# Patient Record
Sex: Female | Born: 1980 | Race: White | Hispanic: No | Marital: Married | Smoking: Never smoker
Health system: Southern US, Community
[De-identification: ages and names within clinical notes are randomized; demographics above are authoritative.]

## PROBLEM LIST (undated history)

## (undated) ENCOUNTER — Inpatient Hospital Stay (HOSPITAL_COMMUNITY): Payer: Self-pay

## (undated) DIAGNOSIS — R87629 Unspecified abnormal cytological findings in specimens from vagina: Secondary | ICD-10-CM

## (undated) DIAGNOSIS — Z789 Other specified health status: Secondary | ICD-10-CM

## (undated) HISTORY — PX: COLPOSCOPY: SHX161

## (undated) HISTORY — DX: Unspecified abnormal cytological findings in specimens from vagina: R87.629

## (undated) HISTORY — PX: NO PAST SURGERIES: SHX2092

---
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
## 2001-02-03 ENCOUNTER — Emergency Department (HOSPITAL_COMMUNITY): Admission: EM | Admit: 2001-02-03 | Discharge: 2001-02-03 | Payer: Self-pay | Admitting: Emergency Medicine

## 2003-04-03 ENCOUNTER — Inpatient Hospital Stay (HOSPITAL_COMMUNITY): Admission: AD | Admit: 2003-04-03 | Discharge: 2003-04-05 | Payer: Self-pay | Admitting: Obstetrics

## 2004-01-14 ENCOUNTER — Other Ambulatory Visit: Admission: RE | Admit: 2004-01-14 | Discharge: 2004-01-14 | Payer: Self-pay | Admitting: Obstetrics and Gynecology

## 2005-01-27 ENCOUNTER — Other Ambulatory Visit: Admission: RE | Admit: 2005-01-27 | Discharge: 2005-01-27 | Payer: Self-pay | Admitting: Obstetrics and Gynecology

## 2009-02-18 ENCOUNTER — Inpatient Hospital Stay (HOSPITAL_COMMUNITY): Admission: AD | Admit: 2009-02-18 | Discharge: 2009-02-18 | Payer: Self-pay | Admitting: Obstetrics & Gynecology

## 2009-02-21 ENCOUNTER — Inpatient Hospital Stay (HOSPITAL_COMMUNITY): Admission: AD | Admit: 2009-02-21 | Discharge: 2009-02-23 | Payer: Self-pay | Admitting: Obstetrics and Gynecology

## 2010-11-21 LAB — CBC
HCT: 23.7 % — ABNORMAL LOW (ref 36.0–46.0)
HCT: 30.1 % — ABNORMAL LOW (ref 36.0–46.0)
Hemoglobin: 10.3 g/dL — ABNORMAL LOW (ref 12.0–15.0)
Hemoglobin: 8.2 g/dL — ABNORMAL LOW (ref 12.0–15.0)
MCHC: 34.1 g/dL (ref 30.0–36.0)
MCHC: 34.4 g/dL (ref 30.0–36.0)
MCV: 87.3 fL (ref 78.0–100.0)
MCV: 87.5 fL (ref 78.0–100.0)
Platelets: 183 10*3/uL (ref 150–400)
Platelets: 216 10*3/uL (ref 150–400)
RBC: 2.71 MIL/uL — ABNORMAL LOW (ref 3.87–5.11)
RBC: 3.45 MIL/uL — ABNORMAL LOW (ref 3.87–5.11)
RDW: 14.2 % (ref 11.5–15.5)
RDW: 14.2 % (ref 11.5–15.5)
WBC: 10.2 10*3/uL (ref 4.0–10.5)
WBC: 10.2 10*3/uL (ref 4.0–10.5)

## 2010-11-21 LAB — RPR: RPR Ser Ql: NONREACTIVE

## 2010-12-28 NOTE — Discharge Summary (Signed)
NAMELEYLI, KEVORKIAN NO.:  [id number]   MEDICAL RECORD NO.:  [id number]          PATIENT TYPE:  INP   LOCATION:  9146                          FACILITY:  [facility]   PHYSICIAN:  Gerrit Friends. Aldona Bar, M.D.   DATE OF BIRTH:  04-Sep-1980   DATE OF ADMISSION:  02/21/2009  DATE OF DISCHARGE:  02/23/2009                               DISCHARGE SUMMARY   DISCHARGE DIAGNOSES:  1. Term pregnancy, delivered 8 pounds female infant, Apgars 9 and 9.  2. Blood type A positive.   PROCEDURES:  1. Amniotomy.  2. Normal spontaneous delivery.  3. First-degree tear and repair.   SUMMARY:  This 30 year old gravida 5, now para 2 was admitted with  active labor on the evening of February 21, 2009, and after amniotomy which  produced clear fluid.  She had a rapid second stage and delivered a  viable 8 pounds female infant with Apgars of 9 and 9 over a first-degree  tear, which was repaired without difficulty.  Her postpartum course was  benign.  She is breast-feeding and bottle feeding.  She did desire Depo-  Provera prior to discharge for contraception.  Her discharge hemoglobin  was 8.2 with a white count of 10,200, and platelet count of 183,000.   MEDICATIONS AT THE TIME OF DISCHARGE:  Vitamins - one a day as long as  she is breast-feeding, and she will use Feosol capsules - one daily.   She will return to the office for followup in approximately 4 weeks'  time.  She will use Tylenol or Advil as needed for discomfort.  She was  given an instruction sheet at the time of discharge and all instructions  were explained and understood.   CONDITION ON DISCHARGE:  Improved.      Gerrit Friends. Aldona Bar, M.D.  Electronically Signed     RMW/MEDQ  D:  02/23/2009  T:  02/23/2009  Job:  [id number]

## 2015-04-28 ENCOUNTER — Inpatient Hospital Stay (HOSPITAL_COMMUNITY)
Admission: AD | Admit: 2015-04-28 | Discharge: 2015-04-28 | Discharge status: Against Medical Advice | Payer: Managed Care, Other (non HMO) | Source: Ambulatory Visit | Attending: Obstetrics and Gynecology | Admitting: Obstetrics and Gynecology

## 2015-04-28 DIAGNOSIS — Z5320 Procedure and treatment not carried out because of patient's decision for unspecified reasons: Secondary | ICD-10-CM | POA: Insufficient documentation

## 2015-04-28 DIAGNOSIS — R109 Unspecified abdominal pain: Secondary | ICD-10-CM | POA: Diagnosis present

## 2015-04-28 LAB — URINALYSIS, ROUTINE W REFLEX MICROSCOPIC
Bilirubin Urine: NEGATIVE
GLUCOSE, UA: NEGATIVE mg/dL
KETONES UR: NEGATIVE mg/dL
LEUKOCYTES UA: NEGATIVE
Nitrite: NEGATIVE
PH: 6 (ref 5.0–8.0)
Protein, ur: NEGATIVE mg/dL
Specific Gravity, Urine: <1.005 — ABNORMAL LOW (ref 1.005–1.030)
Urobilinogen, UA: 0.2 mg/dL (ref 0.0–1.0)

## 2015-04-28 LAB — URINE MICROSCOPIC-ADD ON

## 2015-04-28 LAB — POCT PREGNANCY, URINE: Preg Test, Ur: POSITIVE — AB

## 2015-04-28 NOTE — MAU Note (Signed)
Pt not in lobby.  

## 2015-04-28 NOTE — MAU Note (Signed)
Not in lobby

## 2015-04-28 NOTE — MAU Note (Signed)
Pt C/O bleeding with urination that started yesterday.  C/O L flank pain.  Denies vaginal bleeding or discharge.

## 2015-05-01 ENCOUNTER — Encounter (HOSPITAL_COMMUNITY): Payer: Self-pay | Admitting: *Deleted

## 2015-05-01 ENCOUNTER — Inpatient Hospital Stay (HOSPITAL_COMMUNITY)
Admission: AD | Admit: 2015-05-01 | Discharge: 2015-05-01 | Disposition: A | Discharge status: Home / Self Care | Payer: Managed Care, Other (non HMO) | Source: Ambulatory Visit | Attending: Obstetrics and Gynecology | Admitting: Obstetrics and Gynecology

## 2015-05-01 ENCOUNTER — Inpatient Hospital Stay (HOSPITAL_COMMUNITY): Payer: Managed Care, Other (non HMO)

## 2015-05-01 DIAGNOSIS — Z3A01 Less than 8 weeks gestation of pregnancy: Secondary | ICD-10-CM | POA: Diagnosis not present

## 2015-05-01 DIAGNOSIS — O209 Hemorrhage in early pregnancy, unspecified: Secondary | ICD-10-CM

## 2015-05-01 DIAGNOSIS — O3680X0 Pregnancy with inconclusive fetal viability, not applicable or unspecified: Secondary | ICD-10-CM

## 2015-05-01 DIAGNOSIS — O200 Threatened abortion: Secondary | ICD-10-CM | POA: Diagnosis not present

## 2015-05-01 HISTORY — DX: Other specified health status: Z78.9

## 2015-05-01 LAB — URINE MICROSCOPIC-ADD ON

## 2015-05-01 LAB — URINALYSIS, ROUTINE W REFLEX MICROSCOPIC
BILIRUBIN URINE: NEGATIVE
GLUCOSE, UA: NEGATIVE mg/dL
KETONES UR: NEGATIVE mg/dL
NITRITE: NEGATIVE
PH: 7 (ref 5.0–8.0)
Protein, ur: NEGATIVE mg/dL
SPECIFIC GRAVITY, URINE: 1.01 (ref 1.005–1.030)
Urobilinogen, UA: 0.2 mg/dL (ref 0.0–1.0)

## 2015-05-01 LAB — CBC
HEMATOCRIT: 38.3 % (ref 36.0–46.0)
HEMOGLOBIN: 13.2 g/dL (ref 12.0–15.0)
MCH: 32.1 pg (ref 26.0–34.0)
MCHC: 34.5 g/dL (ref 30.0–36.0)
MCV: 93.2 fL (ref 78.0–100.0)
PLATELETS: 290 10*3/uL (ref 150–400)
RBC: 4.11 MIL/uL (ref 3.87–5.11)
RDW: 13.2 % (ref 11.5–15.5)
WBC: 8.5 10*3/uL (ref 4.0–10.5)

## 2015-05-01 LAB — HCG, QUANTITATIVE, PREGNANCY: hCG, Beta Chain, Quant, S: 4337 m[IU]/mL — ABNORMAL HIGH (ref <5)

## 2015-05-01 LAB — ABO/RH: ABO/RH(D): A POS

## 2015-05-01 MED ORDER — ACETAMINOPHEN 500 MG PO TABS: 1000.0000 mg | ORAL_TABLET | Freq: Once | ORAL | Status: DC

## 2015-05-01 NOTE — MAU Provider Note (Signed)
History     CSN: [id number]  Arrival date and time: 05/01/15 1831   First Provider Initiated Contact with Patient 05/01/15 2008      Chief Complaint  Patient presents with  . Vaginal Bleeding   HPI   Ms.Cynthia Logan is a 34 y.o. female G3P2002 at [redacted]w[redacted]d presenting to MAU with vaginal bleeding. The Bleeding started Monday; last intercourse was Sunday. The bleeding is described as Brown/red in color. She has had off and on lower abdominal cramping since then as well.   She has an Appointment scheduled on 9/30 with [facility]  OB History    Gravida Para Term Preterm AB TAB SAB Ectopic Multiple Living   1               Past Medical History  Diagnosis Date  . Medical history non-contributory     Past Surgical History  Procedure Laterality Date  . No past surgeries      Family History  Problem Relation Age of Onset  . Alcohol abuse Neg Hx     Social History  Substance Use Topics  . Smoking status: Not on file  . Smokeless tobacco: Not on file  . Alcohol Use: Not on file    Allergies: Allergies not on file  No prescriptions prior to admission   Results for orders placed or performed during the hospital encounter of 05/01/15 (from the past 48 hour(s))  Urinalysis, Routine w reflex microscopic (not at [facility])     Status: Abnormal   Collection Time: 05/01/15  7:33 PM  Result Value Ref Range   Color, Urine YELLOW YELLOW   APPearance CLEAR CLEAR   Specific Gravity, Urine 1.010 1.005 - 1.030   pH 7.0 5.0 - 8.0   Glucose, UA NEGATIVE NEGATIVE mg/dL   Hgb urine dipstick LARGE (A) NEGATIVE   Bilirubin Urine NEGATIVE NEGATIVE   Ketones, ur NEGATIVE NEGATIVE mg/dL   Protein, ur NEGATIVE NEGATIVE mg/dL   Urobilinogen, UA 0.2 0.0 - 1.0 mg/dL   Nitrite NEGATIVE NEGATIVE   Leukocytes, UA TRACE (A) NEGATIVE  Urine microscopic-add on     Status: Abnormal   Collection Time: 05/01/15  7:33 PM  Result Value Ref Range   Squamous Epithelial / LPF RARE RARE   WBC, UA  0-2 <3 WBC/hpf   RBC / HPF 3-6 <3 RBC/hpf   Bacteria, UA FEW (A) RARE  CBC     Status: None   Collection Time: 05/01/15  8:28 PM  Result Value Ref Range   WBC 8.5 4.0 - 10.5 K/uL   RBC 4.11 3.87 - 5.11 MIL/uL   Hemoglobin 13.2 12.0 - 15.0 g/dL   HCT 40.9 81.1 - 91.4 %   MCV 93.2 78.0 - 100.0 fL   MCH 32.1 26.0 - 34.0 pg   MCHC 34.5 30.0 - 36.0 g/dL   RDW 78.2 95.6 - 21.3 %   Platelets 290 150 - 400 K/uL  ABO/Rh     Status: None   Collection Time: 05/01/15  8:28 PM  Result Value Ref Range   ABO/RH(D) A POS   hCG, quantitative, pregnancy     Status: Abnormal   Collection Time: 05/01/15  8:28 PM  Result Value Ref Range   hCG, Beta Chain, Quant, S 4337 (H) <5 mIU/mL    Comment:          GEST. AGE      CONC.  (mIU/mL)   <=1 WEEK        5 -  50     2 WEEKS       50 - 500     3 WEEKS       100 - 10,000     4 WEEKS     1,000 - 30,000     5 WEEKS     3,500 - 115,000   6-8 WEEKS     12,000 - 270,000    12 WEEKS     15,000 - 220,000        FEMALE AND NON-PREGNANT FEMALE:     LESS THAN 5 mIU/mL    US Ob Comp Less 14 Wks  05/01/2015   CLINICAL DATA:  34 year old female with positive pregnancy test.  EXAM: OBSTETRIC <14 WK [place] AND TRANSVAGINAL OB US  TECHNIQUE: Both transabdominal and transvaginal ultrasound examinations were performed for complete evaluation of the gestation as well as the maternal uterus, adnexal regions, and pelvic cul-de-sac. Transvaginal technique was performed to assess early pregnancy.  COMPARISON:  None.  FINDINGS: Send the uterus is anteverted. There is a cystic structure within the endometrium with some surrounding decidual reaction. No yolk sac or fetal pole identified. This cystic structure may represent an early gestational sac. A blighted ovum or pseudo gestation of an ectopic pregnancy are not excluded. Correlation with serial HCG levels follow-up with ultrasound is recommended.  Diffuse cystic structure is a true gestational sac with mean sac diameter of 6 mm  corresponds to an estimated gestational age of [redacted] weeks, 2 days.  The right ovary measures 3.8 x 2.4 x 2.8 cm. The corpus luteum is noted in the right ovary. There is a 2.9 x 1.6 x 1.7 cm paraovarian cyst in the region of the right adnexa.  The left ovary measures 2.5 x 1.6 x 2.0 cm.  IMPRESSION: Cystic structure within the endometrial as described likely an early gestational sac. Correlation with serial HCG levels and follow-up with ultrasound recommended.   Electronically Signed   By: Elgie Collard M.D.   On: 05/01/2015 22:37   US Ob Transvaginal  05/01/2015   CLINICAL DATA:  34 year old female with positive pregnancy test.  EXAM: OBSTETRIC <14 WK [place] AND TRANSVAGINAL OB US  TECHNIQUE: Both transabdominal and transvaginal ultrasound examinations were performed for complete evaluation of the gestation as well as the maternal uterus, adnexal regions, and pelvic cul-de-sac. Transvaginal technique was performed to assess early pregnancy.  COMPARISON:  None.  FINDINGS: Send the uterus is anteverted. There is a cystic structure within the endometrium with some surrounding decidual reaction. No yolk sac or fetal pole identified. This cystic structure may represent an early gestational sac. A blighted ovum or pseudo gestation of an ectopic pregnancy are not excluded. Correlation with serial HCG levels follow-up with ultrasound is recommended.  Diffuse cystic structure is a true gestational sac with mean sac diameter of 6 mm corresponds to an estimated gestational age of [redacted] weeks, 2 days.  The right ovary measures 3.8 x 2.4 x 2.8 cm. The corpus luteum is noted in the right ovary. There is a 2.9 x 1.6 x 1.7 cm paraovarian cyst in the region of the right adnexa.  The left ovary measures 2.5 x 1.6 x 2.0 cm.  IMPRESSION: Cystic structure within the endometrial as described likely an early gestational sac. Correlation with serial HCG levels and follow-up with ultrasound recommended.   Electronically Signed   By: Elgie Collard M.D.   On: 05/01/2015 22:37    Review of Systems  Constitutional: Negative for fever  and chills.  Gastrointestinal: Negative for nausea, vomiting and abdominal pain.   Physical Exam   Blood pressure 127/78, pulse 93, temperature 98.5 F (36.9 C), resp. rate 18, height 5\' 2"  (1.575 m), weight 73.483 kg (162 lb), last menstrual period 03/17/2015.  Physical Exam  Constitutional: She is oriented to person, place, and time. She appears well-developed and well-nourished. No distress.  HENT:  Head: Normocephalic.  Eyes: Pupils are equal, round, and reactive to light.  Neck: Neck supple.  GI: Soft. There is tenderness in the suprapubic area. There is no rigidity, no rebound and no guarding.  Genitourinary:  Speculum exam: Vagina - Small amount of dark red blood pooling in the vagina.  Cervix - + active bleeding  Bimanual exam: Cervix closed Uterus non tender, normal size Adnexa non tender, no masses bilaterally Chaperone present for exam.  Musculoskeletal: Normal range of motion.  Neurological: She is alert and oriented to person, place, and time.  Skin: Skin is warm. She is not diaphoretic.  Psychiatric: Her behavior is normal.    MAU Course  Procedures  None  MDM  A positive blood type  Discussed HPI> [place] and labs with Dr. Claiborne Billings at 10:45 pm. Patient to follow up in the office on Monday for repeat Quant.   Assessment and Plan   A:  1. Pregnancy of unknown anatomic location   2. Vaginal bleeding in pregnancy, first trimester   3. Threatened miscarriage    P:  Discharge home in stable condition Pelvic rest Bleeding precautions Return to MAU if symptoms worsen Call the office on Monday to schedule an appointment for a follow up beta hcg level   Duane Lope, NP 05/01/2015 8:09 PM

## 2015-05-01 NOTE — MAU Note (Signed)
Some vag bleeding since Monday. Came here Tues. But left after waiting 3 hrs. Pain lower back and R lower abd. Some bright bleeding today mostly when go to [facility]. Have used one panty liner

## 2015-05-01 NOTE — Progress Notes (Signed)
Jennifer Rasch NP in earlier to discuss test results and d/c plan. Written and verbal d/c instructions given and understanding voiced. 

## 2015-05-02 LAB — HIV ANTIBODY (ROUTINE TESTING W REFLEX): HIV Screen 4th Generation wRfx: NONREACTIVE

## 2016-03-03 LAB — OB RESULTS CONSOLE RPR: RPR: NONREACTIVE

## 2016-03-03 LAB — OB RESULTS CONSOLE RUBELLA ANTIBODY, IGM: RUBELLA: NON-IMMUNE/NOT IMMUNE

## 2016-03-03 LAB — OB RESULTS CONSOLE ANTIBODY SCREEN: ANTIBODY SCREEN: NEGATIVE

## 2016-03-03 LAB — OB RESULTS CONSOLE ABO/RH: RH Type: POSITIVE

## 2016-03-03 LAB — OB RESULTS CONSOLE GC/CHLAMYDIA
CHLAMYDIA, DNA PROBE: NEGATIVE
Gonorrhea: NEGATIVE

## 2016-03-03 LAB — OB RESULTS CONSOLE HIV ANTIBODY (ROUTINE TESTING): HIV: NONREACTIVE

## 2016-03-03 LAB — OB RESULTS CONSOLE HEPATITIS B SURFACE ANTIGEN: HEP B S AG: NEGATIVE

## 2016-03-05 ENCOUNTER — Encounter (HOSPITAL_COMMUNITY): Payer: Self-pay | Admitting: *Deleted

## 2016-08-15 NOTE — L&D Delivery Note (Signed)
Pt was admitted last pm. She was given one dose of cytotec.She was 1 cm this am. She was started on pit aug. She had intermittent variable and late decels with good reactivity. She rapidly completed the first stage. She pushed one time and had a SVD of one live viable infant over a first degree tear in the ROA position. There was a nuchal cord x 1 and a true knot x 1. Placenta- S/I wnl. Tear closed with 3-0 chromic. Baby to NBN. EBL-400cc.

## 2016-09-07 ENCOUNTER — Encounter (HOSPITAL_COMMUNITY): Payer: Self-pay | Admitting: *Deleted

## 2016-09-07 ENCOUNTER — Inpatient Hospital Stay (HOSPITAL_COMMUNITY)
Admission: AD | Admit: 2016-09-07 | Discharge: 2016-09-07 | Disposition: A | Discharge status: Home / Self Care | Payer: Commercial Managed Care - PPO | Source: Ambulatory Visit | Attending: Obstetrics & Gynecology | Admitting: Obstetrics & Gynecology

## 2016-09-07 DIAGNOSIS — O26893 Other specified pregnancy related conditions, third trimester: Secondary | ICD-10-CM | POA: Diagnosis not present

## 2016-09-07 DIAGNOSIS — Z3A34 34 weeks gestation of pregnancy: Secondary | ICD-10-CM | POA: Diagnosis not present

## 2016-09-07 DIAGNOSIS — R51 Headache: Secondary | ICD-10-CM | POA: Diagnosis not present

## 2016-09-07 DIAGNOSIS — R030 Elevated blood-pressure reading, without diagnosis of hypertension: Secondary | ICD-10-CM | POA: Diagnosis not present

## 2016-09-07 LAB — CBC
HEMATOCRIT: 30.3 % — AB (ref 36.0–46.0)
HEMOGLOBIN: 10.1 g/dL — AB (ref 12.0–15.0)
MCH: 30.7 pg (ref 26.0–34.0)
MCHC: 33.3 g/dL (ref 30.0–36.0)
MCV: 92.1 fL (ref 78.0–100.0)
Platelets: 196 10*3/uL (ref 150–400)
RBC: 3.29 MIL/uL — ABNORMAL LOW (ref 3.87–5.11)
RDW: 14.6 % (ref 11.5–15.5)
WBC: 9.2 10*3/uL (ref 4.0–10.5)

## 2016-09-07 LAB — COMPREHENSIVE METABOLIC PANEL
ALBUMIN: 3.1 g/dL — AB (ref 3.5–5.0)
ALK PHOS: 122 U/L (ref 38–126)
ALT: 23 U/L (ref 14–54)
ANION GAP: 9 (ref 5–15)
AST: 23 U/L (ref 15–41)
BILIRUBIN TOTAL: 0.6 mg/dL (ref 0.3–1.2)
BUN: 6 mg/dL (ref 6–20)
CO2: 19 mmol/L — ABNORMAL LOW (ref 22–32)
Calcium: 9 mg/dL (ref 8.9–10.3)
Chloride: 105 mmol/L (ref 101–111)
Creatinine, Ser: 0.63 mg/dL (ref 0.44–1.00)
GFR calc Af Amer: >60 mL/min (ref >60)
GFR calc non Af Amer: >60 mL/min (ref >60)
GLUCOSE: 110 mg/dL — AB (ref 65–99)
POTASSIUM: 3.9 mmol/L (ref 3.5–5.1)
SODIUM: 133 mmol/L — AB (ref 135–145)
TOTAL PROTEIN: 6.6 g/dL (ref 6.5–8.1)

## 2016-09-07 LAB — URINALYSIS, ROUTINE W REFLEX MICROSCOPIC
Bilirubin Urine: NEGATIVE
Glucose, UA: NEGATIVE mg/dL
Hgb urine dipstick: NEGATIVE
Ketones, ur: NEGATIVE mg/dL
LEUKOCYTES UA: NEGATIVE
Nitrite: NEGATIVE
PROTEIN: NEGATIVE mg/dL
SPECIFIC GRAVITY, URINE: 1.011 (ref 1.005–1.030)
pH: 6 (ref 5.0–8.0)

## 2016-09-07 LAB — PROTEIN / CREATININE RATIO, URINE
Creatinine, Urine: 58 mg/dL
PROTEIN CREATININE RATIO: 0.14 mg/mg{creat} (ref 0.00–0.15)
Total Protein, Urine: 8 mg/dL

## 2016-09-07 MED ORDER — BUTALBITAL-APAP-CAFFEINE 50-325-40 MG PO TABS
2.0000 | ORAL_TABLET | Freq: Once | ORAL | Status: AC
  Administered 2016-09-07: 2 via ORAL
  Filled 2016-09-07: qty 2

## 2016-09-07 MED ORDER — BUTALBITAL-APAP-CAFFEINE 50-325-40 MG PO TABS: 1.0000 | ORAL_TABLET | Freq: Four times a day (QID) | ORAL | 0 refills | Status: AC | PRN

## 2016-09-07 NOTE — MAU Note (Signed)
Sent from office with elevated BP,  +HA- pt took Tylenol, no relief.  Denies visual changes or epigastric. Edema increased, pitting noted in lower extremities

## 2016-09-07 NOTE — Discharge Instructions (Signed)
General Headache Without Cause Introduction A headache is pain or discomfort felt around the head or neck area. There are many causes and types of headaches. In some cases, the cause may not be found. Follow these instructions at home: Managing pain  Take over-the-counter and prescription medicines only as told by your doctor.  Lie down in a dark, quiet room when you have a headache.  If directed, apply ice to the head and neck area:  Put ice in a plastic bag.  Place a towel between your skin and the bag.  Leave the ice on for 20 minutes, 2-3 times per day.  Use a heating pad or hot shower to apply heat to the head and neck area as told by your doctor.  Keep lights dim if bright lights bother you or make your headaches worse. Eating and drinking  Eat meals on a regular schedule.  Lessen how much alcohol you drink.  Lessen how much caffeine you drink, or stop drinking caffeine. General instructions  Keep all follow-up visits as told by your doctor. This is important.  Keep a journal to find out if certain things bring on headaches. For example, write down:  What you eat and drink.  How much sleep you get.  Any change to your diet or medicines.  Relax by getting a massage or doing other relaxing activities.  Lessen stress.  Sit up straight. Do not tighten (tense) your muscles.  Do not use tobacco products. This includes cigarettes, chewing tobacco, or e-cigarettes. If you need help quitting, ask your doctor.  Exercise regularly as told by your doctor.  Get enough sleep. This often means 7-9 hours of sleep. Contact a doctor if:  Your symptoms are not helped by medicine.  You have a headache that feels different than the other headaches.  You feel sick to your stomach (nauseous) or you throw up (vomit).  You have a fever. Get help right away if:  Your headache becomes really bad.  You keep throwing up.  You have a stiff neck.  You have trouble  seeing.  You have trouble speaking.  You have pain in the eye or ear.  Your muscles are weak or you lose muscle control.  You lose your balance or have trouble walking.  You feel like you will pass out (faint) or you pass out.  You have confusion. This information is not intended to replace advice given to you by your health care provider. Make sure you discuss any questions you have with your health care provider. Document Released: 05/10/2008 Document Revised: 01/07/2016 Document Reviewed: 11/24/2014  2017 Elsevier  

## 2016-09-07 NOTE — MAU Provider Note (Signed)
History     CSN: [id number]  Arrival date and time: 09/07/16 1035   First Provider Initiated Contact with Patient 09/07/16 1111      Chief Complaint  Patient presents with  . pre-eclampsia eval  . Headache  . Hypertension   HPI   Ms.Cynthia Logan is a 36 y.o. female [phone] @ 65w4dhere in MAU with elevated BP readings. She was seen by her OB this morning in the office and had elevated BP readings. + fetal movements.   She started having a HA last night. She took 2 extra strength tylenol and then repeated that this morning. This did not help her HA. She currently rates her HA 5/10. She has had HA in the past. Prior to pregnancy she had frequent HA. Ibuprofen resolved her HA's in the past.   BP in the office 150/100 then 126/90 per Dr. PAlwyn Pea   OB History    Gravida Para Term Preterm AB Living   6 2 2  0 3 2   SAB TAB Ectopic Multiple Live Births   3 0 0 0        Past Medical History:  Diagnosis Date  . Medical history non-contributory     Past Surgical History:  Procedure Laterality Date  . NO PAST SURGERIES      Family History  Problem Relation Age of Onset  . Alcohol abuse Neg Hx     Social History  Substance Use Topics  . Smoking status: Never Smoker  . Smokeless tobacco: Not on file  . Alcohol use Yes     Comment: social but not with pregnancy    Allergies: No Known Allergies  Prescriptions Prior to Admission  Medication Sig Dispense Refill Last Dose  . acetaminophen (TYLENOL) 500 MG tablet Take 500 mg by mouth every 6 (six) hours as needed for mild pain.   09/07/2016 at Unknown time  . Prenatal Vit-Fe Fumarate-FA (PRENATAL MULTIVITAMIN) TABS tablet Take 1 tablet by mouth daily at 12 noon.   09/07/2016 at Unknown time   Results for orders placed or performed during the hospital encounter of 09/07/16 (from the past 48 hour(s))  Protein / creatinine ratio, urine     Status: None   Collection Time: 09/07/16 10:45 AM  Result Value Ref Range   Creatinine,  Urine 58.00 mg/dL   Total Protein, Urine 8 mg/dL    Comment: NO NORMAL RANGE ESTABLISHED FOR THIS TEST   Protein Creatinine Ratio 0.14 0.00 - 0.15 mg/mg[Cre]  Urinalysis, Routine w reflex microscopic     Status: Abnormal   Collection Time: 09/07/16 10:45 AM  Result Value Ref Range   Color, Urine YELLOW YELLOW   APPearance HAZY (A) CLEAR   Specific Gravity, Urine 1.011 1.005 - 1.030   pH 6.0 5.0 - 8.0   Glucose, UA NEGATIVE NEGATIVE mg/dL   Hgb urine dipstick NEGATIVE NEGATIVE   Bilirubin Urine NEGATIVE NEGATIVE   Ketones, ur NEGATIVE NEGATIVE mg/dL   Protein, ur NEGATIVE NEGATIVE mg/dL   Nitrite NEGATIVE NEGATIVE   Leukocytes, UA NEGATIVE NEGATIVE  CBC     Status: Abnormal   Collection Time: 09/07/16 11:10 AM  Result Value Ref Range   WBC 9.2 4.0 - 10.5 K/uL   RBC 3.29 (L) 3.87 - 5.11 MIL/uL   Hemoglobin 10.1 (L) 12.0 - 15.0 g/dL   HCT 30.3 (L) 36.0 - 46.0 %   MCV 92.1 78.0 - 100.0 fL   MCH 30.7 26.0 - 34.0 pg   MCHC 33.3 30.0 -  36.0 g/dL   RDW 14.6 11.5 - 15.5 %   Platelets 196 150 - 400 K/uL  Comprehensive metabolic panel     Status: Abnormal   Collection Time: 09/07/16 11:10 AM  Result Value Ref Range   Sodium 133 (L) 135 - 145 mmol/L   Potassium 3.9 3.5 - 5.1 mmol/L   Chloride 105 101 - 111 mmol/L   CO2 19 (L) 22 - 32 mmol/L   Glucose, Bld 110 (H) 65 - 99 mg/dL   BUN 6 6 - 20 mg/dL   Creatinine, Ser 0.63 0.44 - 1.00 mg/dL   Calcium 9.0 8.9 - 10.3 mg/dL   Total Protein 6.6 6.5 - 8.1 g/dL   Albumin 3.1 (L) 3.5 - 5.0 g/dL   AST 23 15 - 41 U/L   ALT 23 14 - 54 U/L   Alkaline Phosphatase 122 38 - 126 U/L   Total Bilirubin 0.6 0.3 - 1.2 mg/dL   GFR calc non Af Amer >60 >60 mL/min   GFR calc Af Amer >60 >60 mL/min    Comment: (NOTE) The eGFR has been calculated using the CKD EPI equation. This calculation has not been validated in all clinical situations. eGFR's persistently <60 mL/min signify possible Chronic Kidney Disease.    Anion gap 9 5 - 15   Review of  Systems  Eyes: Negative for visual disturbance.  Gastrointestinal: Negative for abdominal pain.  Genitourinary: Negative for pelvic pain.  Neurological: Positive for headaches (Started yesterday. ).   Physical Exam   Blood pressure 124/85, pulse 100, temperature 97.4 F (36.3 C), temperature source Oral, resp. rate 18, weight 198 lb 12 oz (90.2 kg), SpO2 100 %, unknown if currently breastfeeding.   Patient Vitals for the past 24 hrs:  BP Temp Temp src Pulse Resp SpO2 Weight  09/07/16 1132 124/85 - - 97 - - -  09/07/16 1117 126/85 - - 98 - - -  09/07/16 1102 124/85 - - 101 - - -  09/07/16 1058 119/82 - - 89 - - -  09/07/16 1052 134/85 97.4 F (36.3 C) Oral 94 18 100 % -  09/07/16 1051 - - - - - - 198 lb 12 oz (90.2 kg)    Physical Exam  Constitutional: She is oriented to person, place, and time. She appears well-developed and well-nourished. No distress.  HENT:  Head: Normocephalic.  Eyes: Pupils are equal, round, and reactive to light.  Respiratory: Effort normal.  GI: Soft. She exhibits no distension. There is no tenderness. There is no rebound.  Musculoskeletal: Normal range of motion.  Neurological: She is alert and oriented to person, place, and time. She displays normal reflexes.  Skin: Skin is warm. She is not diaphoretic.  Psychiatric: Her behavior is normal.   Fetal Tracing: Baseline: 145 bpm  Variability: Moderate  Accelerations: 15x15 Decelerations: quick variables  Toco: occasional contraction, some UI   MAU Course  Procedures  None  MDM  PIH labs NST Fioricet 2 tabs Discussed patient with Dr. Alwyn Pea @ 1215, discussed HPI, labs Ha down from 5/10 to 2/10  Assessment and Plan    A:  1. Elevated BP without diagnosis of hypertension   2. Headache in pregnancy, third trimester     P:  Discharge home in stable condition Strict return precautions Preeclampsia precautions Rx: Fioricet  Return to MAU if symptoms worsen    Lezlie Lye,  NP 09/07/2016 2:04 PM

## 2016-09-13 LAB — OB RESULTS CONSOLE GBS: GBS: NEGATIVE

## 2016-09-19 ENCOUNTER — Encounter (HOSPITAL_COMMUNITY): Payer: Self-pay | Admitting: *Deleted

## 2016-09-19 ENCOUNTER — Telehealth (HOSPITAL_COMMUNITY): Payer: Self-pay | Admitting: *Deleted

## 2016-09-19 NOTE — Telephone Encounter (Signed)
Preadmission screen  

## 2016-09-23 ENCOUNTER — Other Ambulatory Visit: Payer: Self-pay | Admitting: Obstetrics and Gynecology

## 2016-09-26 ENCOUNTER — Inpatient Hospital Stay (HOSPITAL_COMMUNITY)
Admission: RE | Admit: 2016-09-26 | Discharge: 2016-09-28 | DRG: 774 | Disposition: A | Discharge status: Home / Self Care | Payer: Commercial Managed Care - PPO | Source: Ambulatory Visit | Attending: Obstetrics and Gynecology | Admitting: Obstetrics and Gynecology

## 2016-09-26 ENCOUNTER — Encounter (HOSPITAL_COMMUNITY): Payer: Self-pay

## 2016-09-26 DIAGNOSIS — Z3A37 37 weeks gestation of pregnancy: Secondary | ICD-10-CM

## 2016-09-26 DIAGNOSIS — O1002 Pre-existing essential hypertension complicating childbirth: Principal | ICD-10-CM | POA: Diagnosis present

## 2016-09-26 DIAGNOSIS — Z3490 Encounter for supervision of normal pregnancy, unspecified, unspecified trimester: Secondary | ICD-10-CM

## 2016-09-26 LAB — COMPREHENSIVE METABOLIC PANEL
ALK PHOS: 139 U/L — AB (ref 38–126)
ALT: 21 U/L (ref 14–54)
ANION GAP: 9 (ref 5–15)
AST: 24 U/L (ref 15–41)
Albumin: 3.2 g/dL — ABNORMAL LOW (ref 3.5–5.0)
BILIRUBIN TOTAL: 0.4 mg/dL (ref 0.3–1.2)
BUN: 9 mg/dL (ref 6–20)
CALCIUM: 8.6 mg/dL — AB (ref 8.9–10.3)
CO2: 20 mmol/L — AB (ref 22–32)
CREATININE: 0.6 mg/dL (ref 0.44–1.00)
Chloride: 104 mmol/L (ref 101–111)
Glucose, Bld: 93 mg/dL (ref 65–99)
Potassium: 3.9 mmol/L (ref 3.5–5.1)
SODIUM: 133 mmol/L — AB (ref 135–145)
TOTAL PROTEIN: 6.5 g/dL (ref 6.5–8.1)

## 2016-09-26 LAB — LACTATE DEHYDROGENASE: LDH: 181 U/L (ref 98–192)

## 2016-09-26 LAB — CBC
HCT: 29.4 % — ABNORMAL LOW (ref 36.0–46.0)
HEMOGLOBIN: 9.8 g/dL — AB (ref 12.0–15.0)
MCH: 30.3 pg (ref 26.0–34.0)
MCHC: 33.3 g/dL (ref 30.0–36.0)
MCV: 91 fL (ref 78.0–100.0)
PLATELETS: 207 10*3/uL (ref 150–400)
RBC: 3.23 MIL/uL — AB (ref 3.87–5.11)
RDW: 14.9 % (ref 11.5–15.5)
WBC: 9.7 10*3/uL (ref 4.0–10.5)

## 2016-09-26 LAB — TYPE AND SCREEN
ABO/RH(D): A POS
ANTIBODY SCREEN: NEGATIVE

## 2016-09-26 LAB — PROTEIN / CREATININE RATIO, URINE
CREATININE, URINE: 68 mg/dL
Protein Creatinine Ratio: 0.19 mg/mg{Cre} — ABNORMAL HIGH (ref 0.00–0.15)
Total Protein, Urine: 13 mg/dL

## 2016-09-26 LAB — URIC ACID: URIC ACID, SERUM: 5.6 mg/dL (ref 2.3–6.6)

## 2016-09-26 LAB — RPR: RPR: NONREACTIVE

## 2016-09-26 MED ORDER — PHENYLEPHRINE 40 MCG/ML (10ML) SYRINGE FOR IV PUSH (FOR BLOOD PRESSURE SUPPORT)
80.0000 ug | PREFILLED_SYRINGE | INTRAVENOUS | Status: DC | PRN
  Filled 2016-09-26: qty 5
  Filled 2016-09-26: qty 10

## 2016-09-26 MED ORDER — OXYTOCIN 40 UNITS IN LACTATED RINGERS INFUSION - SIMPLE MED
2.5000 [IU]/h | INTRAVENOUS | Status: DC
  Filled 2016-09-26: qty 1000

## 2016-09-26 MED ORDER — TETANUS-DIPHTH-ACELL PERTUSSIS 5-2.5-18.5 LF-MCG/0.5 IM SUSP: 0.5000 mL | Freq: Once | INTRAMUSCULAR | Status: DC

## 2016-09-26 MED ORDER — ONDANSETRON HCL 4 MG PO TABS: 4.0000 mg | ORAL_TABLET | ORAL | Status: DC | PRN

## 2016-09-26 MED ORDER — PHENYLEPHRINE 40 MCG/ML (10ML) SYRINGE FOR IV PUSH (FOR BLOOD PRESSURE SUPPORT)
80.0000 ug | PREFILLED_SYRINGE | INTRAVENOUS | Status: DC | PRN
  Filled 2016-09-26: qty 5

## 2016-09-26 MED ORDER — FENTANYL 2.5 MCG/ML BUPIVACAINE 1/10 % EPIDURAL INFUSION (WH - ANES)
12.0000 mL/h | INTRAMUSCULAR | Status: DC | PRN
  Filled 2016-09-26: qty 100

## 2016-09-26 MED ORDER — TERBUTALINE SULFATE 1 MG/ML IJ SOLN
0.2500 mg | Freq: Once | INTRAMUSCULAR | Status: DC | PRN
  Filled 2016-09-26: qty 1

## 2016-09-26 MED ORDER — SIMETHICONE 80 MG PO CHEW
80.0000 mg | CHEWABLE_TABLET | ORAL | Status: DC | PRN
Start: 2016-09-26 — End: 2016-09-28

## 2016-09-26 MED ORDER — SOD CITRATE-CITRIC ACID 500-334 MG/5ML PO SOLN: 30.0000 mL | ORAL | Status: DC | PRN

## 2016-09-26 MED ORDER — EPHEDRINE 5 MG/ML INJ
10.0000 mg | INTRAVENOUS | Status: DC | PRN
  Filled 2016-09-26: qty 4

## 2016-09-26 MED ORDER — ACETAMINOPHEN 325 MG PO TABS: 650.0000 mg | ORAL_TABLET | ORAL | Status: DC | PRN

## 2016-09-26 MED ORDER — COCONUT OIL OIL: 1.0000 "application " | TOPICAL_OIL | Status: DC | PRN

## 2016-09-26 MED ORDER — OXYCODONE-ACETAMINOPHEN 5-325 MG PO TABS: 2.0000 | ORAL_TABLET | ORAL | Status: DC | PRN

## 2016-09-26 MED ORDER — ONDANSETRON HCL 4 MG/2ML IJ SOLN: 4.0000 mg | INTRAMUSCULAR | Status: DC | PRN

## 2016-09-26 MED ORDER — DIPHENHYDRAMINE HCL 50 MG/ML IJ SOLN: 12.5000 mg | INTRAMUSCULAR | Status: DC | PRN

## 2016-09-26 MED ORDER — LIDOCAINE HCL (PF) 1 % IJ SOLN
30.0000 mL | INTRAMUSCULAR | Status: DC | PRN
  Administered 2016-09-26: 30 mL via SUBCUTANEOUS
  Filled 2016-09-26: qty 30

## 2016-09-26 MED ORDER — MEASLES, MUMPS & RUBELLA VAC ~~LOC~~ INJ
0.5000 mL | INJECTION | Freq: Once | SUBCUTANEOUS | Status: DC
  Filled 2016-09-26: qty 0.5

## 2016-09-26 MED ORDER — IBUPROFEN 600 MG PO TABS
600.0000 mg | ORAL_TABLET | Freq: Four times a day (QID) | ORAL | Status: DC
  Administered 2016-09-26 – 2016-09-28 (×6): 600 mg via ORAL
  Filled 2016-09-26 (×7): qty 1

## 2016-09-26 MED ORDER — BENZOCAINE-MENTHOL 20-0.5 % EX AERO
1.0000 "application " | INHALATION_SPRAY | CUTANEOUS | Status: DC | PRN
  Filled 2016-09-26: qty 56

## 2016-09-26 MED ORDER — FLEET ENEMA 7-19 GM/118ML RE ENEM: 1.0000 | ENEMA | RECTAL | Status: DC | PRN

## 2016-09-26 MED ORDER — DIBUCAINE 1 % RE OINT: 1.0000 "application " | TOPICAL_OINTMENT | RECTAL | Status: DC | PRN

## 2016-09-26 MED ORDER — ONDANSETRON HCL 4 MG/2ML IJ SOLN: 4.0000 mg | Freq: Four times a day (QID) | INTRAMUSCULAR | Status: DC | PRN

## 2016-09-26 MED ORDER — LACTATED RINGERS IV SOLN
INTRAVENOUS | Status: DC
  Administered 2016-09-26 (×2): via INTRAVENOUS

## 2016-09-26 MED ORDER — SENNOSIDES-DOCUSATE SODIUM 8.6-50 MG PO TABS
2.0000 | ORAL_TABLET | ORAL | Status: DC
  Administered 2016-09-27 – 2016-09-28 (×2): 2 via ORAL
  Filled 2016-09-26 (×2): qty 2

## 2016-09-26 MED ORDER — LACTATED RINGERS IV SOLN: 500.0000 mL | INTRAVENOUS | Status: DC | PRN

## 2016-09-26 MED ORDER — ZOLPIDEM TARTRATE 5 MG PO TABS: 5.0000 mg | ORAL_TABLET | Freq: Every evening | ORAL | Status: DC | PRN

## 2016-09-26 MED ORDER — BUTORPHANOL TARTRATE 1 MG/ML IJ SOLN: 1.0000 mg | INTRAMUSCULAR | Status: DC | PRN

## 2016-09-26 MED ORDER — OXYCODONE-ACETAMINOPHEN 5-325 MG PO TABS: 1.0000 | ORAL_TABLET | ORAL | Status: DC | PRN

## 2016-09-26 MED ORDER — WITCH HAZEL-GLYCERIN EX PADS: 1.0000 "application " | MEDICATED_PAD | CUTANEOUS | Status: DC | PRN

## 2016-09-26 MED ORDER — OXYTOCIN BOLUS FROM INFUSION
500.0000 mL | Freq: Once | INTRAVENOUS | Status: AC
  Administered 2016-09-26: 500 mL via INTRAVENOUS

## 2016-09-26 MED ORDER — MISOPROSTOL 25 MCG QUARTER TABLET
25.0000 ug | ORAL_TABLET | ORAL | Status: DC | PRN
  Administered 2016-09-26: 25 ug via VAGINAL
  Filled 2016-09-26: qty 0.25
  Filled 2016-09-26: qty 1
  Filled 2016-09-26: qty 0.25

## 2016-09-26 MED ORDER — OXYTOCIN 40 UNITS IN LACTATED RINGERS INFUSION - SIMPLE MED
1.0000 m[IU]/min | INTRAVENOUS | Status: DC
  Administered 2016-09-26: 2 m[IU]/min via INTRAVENOUS

## 2016-09-26 MED ORDER — LACTATED RINGERS IV SOLN: 500.0000 mL | Freq: Once | INTRAVENOUS | Status: DC

## 2016-09-26 NOTE — H&P (Signed)
Pt is a 36 y/o female [id number] at 37 weeks who was admitted by Dr. Henderson CloudHorvath for an induction secondary to HTN. Pt had no genetic testing. She had borderline elevated AFI throughout preg.   PMHX: See [place] PE: VSSAF         HEENT_ wnl         ABD- soft, non tender         FHTs- reactive. IMP/ IUP at 37 wks with HTN         Borderline AFI. ? Etiology Plan/ Admit          Start induction

## 2016-09-26 NOTE — Anesthesia Pain Management Evaluation Note (Signed)
  [facility] Pain Management Visit Note  Patient: Cynthia Logan, 36 y.o., female  "Hello I am a member of the anesthesia team at [facility]. We have an anesthesia team available at all times to provide care throughout the hospital, including epidural management and anesthesia for C-section. I don't know your plan for the delivery whether it a natural birth, water birth, IV sedation, nitrous supplementation, doula or epidural, but we want to meet your pain goals."   1.Was your pain managed to your expectations on prior hospitalizations?   Yes   2.What is your expectation for pain management during this hospitalization?     Labor support without medications  3.How can we help you reach that goal? Natural.  Patient did not want to discuss other pain control options at this time.  I told patient that if she changes her mind and wishes for pain control measures, to let her Logan&D RN know.  Record the patient's initial score and the patient's pain goal.   Pain: 0  Pain Goal: 7 [facility] wants you to be able to say your pain was always managed very well.  Cynthia Logan 09/26/2016

## 2016-09-26 NOTE — Lactation Note (Signed)
This note was copied from a baby's chart. Lactation Consultation Note Experienced BF mom with 37wk. 2 day old infant.  Mom had infant latched and reports feeling baby tug.  LC observed baby with rhythmic jaw movement and encouraged mom to massage breast to increase volume baby takes in when feeding.  Mom states she has a 35 year old that she BF for 18 months and a 35 year old that she BF for 12 months.  She strictly BF them without bottle feeding at all.  She said they both fed well and latched easily.  She did state she had to pump due to mastitis with the 2nd child.  LC discussed the importance of hand expression with mom.  LC demonstrated on alternate breast while infant nursed.  Gtts of colostrum were easily expressed.  LC encouraged mom to hand express before and after feeding baby.  RN had set up pump for mom.  LC reviewed pump, pump parts, and cleaning pump parts.  Encouraged mom to pump after BF in order to stimulate breast to ensure a good milk supply if in the case infant were not effectly nursing due to age.  Mom understood this but declined to have LC assist her with pumping at this time when baby finished feeding.  LC emphasized the importance of feeding infant at least every 3 hours and a minimum of 8/12 times in a 24 hour period.  Mom and dad were very receptive to the information as well as understanding the care and tips provided on the green LPTI sheet.  LC encouraged mom to perform STS and to hand express and if infant were to sleep and not wake to feed and latch well at the breast; her colostrom could be finger fed or spoon fed to the infant.   Size 27 flanges were set up with pump.  Mom's nipples are wider and shorter shafted and have slightly tough tissue but are compressible.  Mom said she was familiar with a pump and pumping and would call out for lactation if there is discomfort when pumping.  LC will discuss with mom's RN flange size and the need to continue to follow up with efficency and  comfort ,p, feels when pumping.  LC provided mom with BF brochure and gave information about BF support groups as well as websites/phone numbers for BF resources.  Mom and dad understand to call out for any questions or concerns regarding lactation or pumping.   Patient Name: Cynthia Logan ZOXWR'UToday's Date: 09/26/2016 Reason for consult: Initial assessment   Maternal Data Has patient been taught Hand Expression?: Yes (gtts of colostrum noted when hand expressing R breast/when baby BF on L) Does the patient have breastfeeding experience prior to this delivery?: Yes  Feeding Feeding Type: Breast Fed Length of feed: 20 min  LATCH Score/Interventions Latch: Repeated attempts needed to sustain latch, nipple held in mouth throughout feeding, stimulation needed to elicit sucking reflex. Intervention(s): Adjust position;Assist with latch;Breast massage;Breast compression  Audible Swallowing: A few with stimulation Intervention(s): Skin to skin;Hand expression  Type of Nipple: Everted at rest and after stimulation (wide)  Comfort (Breast/Nipple): Soft / non-tender     Hold (Positioning): Assistance needed to correctly position infant at breast and maintain latch. Intervention(s): Breastfeeding basics reviewed;Support Pillows;Position options;Skin to skin  LATCH Score: 7  Lactation Tools Discussed/Used Tools: Flanges;Pump Flange Size: 27 Breast pump type: Double-Electric Breast Pump Pump Review: Setup, frequency, and cleaning Initiated by:: Jonny RuizKelly Travonta Gill/Martha Stringer Date initiated:: 09/26/16  Consult Status      Cynthia Logan [facility] 09/26/2016, 3:53 PM

## 2016-09-27 LAB — CBC
HCT: 25.4 % — ABNORMAL LOW (ref 36.0–46.0)
Hemoglobin: 8.6 g/dL — ABNORMAL LOW (ref 12.0–15.0)
MCH: 31 pg (ref 26.0–34.0)
MCHC: 33.9 g/dL (ref 30.0–36.0)
MCV: 91.7 fL (ref 78.0–100.0)
PLATELETS: 152 10*3/uL (ref 150–400)
RBC: 2.77 MIL/uL — ABNORMAL LOW (ref 3.87–5.11)
RDW: 15.2 % (ref 11.5–15.5)
WBC: 9 10*3/uL (ref 4.0–10.5)

## 2016-09-27 NOTE — Progress Notes (Signed)
Patient is eating, ambulating, voiding.  Pain control is good.  Appropriate lochia, no complaints.  Vitals:   09/26/16 1439 09/26/16 1556 09/26/16 2030 09/27/16 0507  BP: 118/73 133/69 119/75 112/72  Pulse: 73 74 91 88  Resp: 20 20 20 18   Temp: 98.6 F (37 C) 98.7 F (37.1 C) 98.4 F (36.9 C) 98.7 F (37.1 C)  TempSrc: Oral Oral Axillary Oral  SpO2: 100% 100% 98%   Weight:      Height:        Fundus firm Ext: no CT  Lab Results  Component Value Date   WBC 9.0 09/27/2016   HGB 8.6 (L) 09/27/2016   HCT 25.4 (L) 09/27/2016   MCV 91.7 09/27/2016   PLT 152 09/27/2016    --/--/A POS (02/12 0140)  A/P Post partum day 1. BPs wnl, will continue to monitor  Routine care.  Expect d/c 2/14    [place], [place]

## 2016-09-28 NOTE — Progress Notes (Signed)
Patient declines Rubella vaccine. Information and explanation given to patient regarding the vaccine. Patient states she does not plan to have another baby and she does not like taking vaccines.

## 2016-09-28 NOTE — Plan of Care (Signed)
Problem: Coping: Goal: Ability to identify and utilize available resources and services will improve Outcome: Completed/Met Date Met: 09/28/16 Patient reports have good support after discharge. FOB has been attentive to patient,s and newborn's needs while in the hospital. Infant is 37.2 weeks at birth with 9% weight loss since birth. Patient is experienced breastfeeding mother and her breast are full. Infant swallows with feedings when latched deep on the breast and mother keeps the baby stimulated. Lactation Consultant have consulted and nursing staff have assisted with breastfeeding. Refer to Lactation Consultant's notes.

## 2016-09-28 NOTE — Lactation Note (Signed)
This note was copied from a baby's chart. Lactation Consultation Note  Patient Name: Cynthia Logan: 09/28/2016 Reason for consult: Follow-up assessment;Infant weight loss RN had assisted Mom with latching baby in cross cradle to left breast. Baby demonstrating few good suckling bursts with some swallows noted. Fell asleep after about 10 minutes. Mom used DEBP to post pump and received total of 26 ml of colostrum. Reviewed awakening techniques and discussed paced feeding with bottle to give supplement back to baby. Feeding plan discussed for d/c home: BF every 2-3 hours waking baby as needed. Baby to BF for up to 30 minutes on 1 breast each feeding, Mom then to post pump for 15-20 minutes and give baby supplement after each feeding per LPT guidelines. Advised 15-20 ml today, increasing per hours of age. Alternate breast with each feeding.  Advised to use DR. Brown #1 bottle to give supplement.  Continue to pump/supplement till weight loss/bili level stabilize. Engorgement care reviewed if needed. Advised of OP services and support group. [facility], [facility].  Mom to call for questions/concerns.   Maternal Data    Feeding Feeding Type: Breast Milk Length of feed: 10 min  LATCH Score/Interventions Latch: Grasps breast easily, tongue down, lips flanged, rhythmical sucking. Intervention(s): Adjust position;Assist with latch;Breast compression  Audible Swallowing: Spontaneous and intermittent (with infant and/or breast stimulation) Intervention(s): Alternate breast massage  Type of Nipple: Everted at rest and after stimulation  Comfort (Breast/Nipple): Filling, red/small blisters or bruises, mild/mod discomfort  Problem noted: Filling Interventions (Filling): Hand pump;Frequent nursing;Double electric pump (infant able to latch, compressible)  Hold (Positioning): Assistance needed to  correctly position infant at breast and maintain latch. Intervention(s): Breastfeeding basics reviewed;Support Pillows;Skin to skin (Lactation consiulatant in to see patient)  LATCH Score: 8  Lactation Tools Discussed/Used Tools: Pump;Flanges Flange Size: 27 Breast pump type: Double-Electric Breast Pump   Consult Status Consult Status: Follow-up Logan: 09/28/16 Follow-up type: In-patient    Alfred LevinsGranger, Xylia Scherger Ann 09/28/2016, 11:08 AM

## 2016-09-28 NOTE — Lactation Note (Signed)
This note was copied from a baby's chart. Lactation Consultation Note  Patient Name: Cynthia Logan ZOXWR'UToday's Date: 09/28/2016 Reason for consult: Follow-up assessment;Infant weight loss Baby at 9.1% weight loss which reflects 6.3% in past 24 hours. Bili at 10.2 at 41 hours of age - high/intermediate zone. Baby 37.2 weeks. Per chart review,  Baby has been to breast 5 times in past 24 hours for 20-30 minutes. 3 voids/2 stools. Mom requested hand pump for home, she also reports having Medela DEBP. Demonstrated hand pump using 27 flange and received approx 5-10 ml of colostrum. Attempted to latch baby but baby sleepy at this visit, last BF at 0730 this am.  Reviewed with Mom the importance of baby being at breast every 2-3 hours. Encouraged supplementation with each feeding till weight loss/bili stabilizes. Encouraged pumping every 3 hours for 15-20 minutes to encourage milk production and to have EBM to supplement. Advised to follow supplemental guidelines per LPT policy. Reviewed early/LPT behaviors with Mom and why she needs to wake baby to BF and pump/supplement.  LC left phone number for Mom to call to observe feeding to assess for milk transfer at breast. Left baby STS on Mom.   Maternal Data    Feeding Feeding Type: Breast Fed Length of feed: 0 min  LATCH Score/Interventions Latch: Too sleepy or reluctant, no latch achieved, no sucking elicited.                    Lactation Tools Discussed/Used Tools: Pump;Flanges Flange Size: 27 Breast pump type: Double-Electric Breast Pump   Consult Status Consult Status: Follow-up Date: 09/28/16 Follow-up type: In-patient    Alfred LevinsGranger, Brittin Belnap Ann 09/28/2016, 9:39 AM

## 2016-09-28 NOTE — Discharge Summary (Signed)
Obstetric Discharge Summary Reason for Admission: induction of labor Prenatal Procedures: NST and ultrasound Intrapartum Procedures: spontaneous vaginal delivery Postpartum Procedures: none Complications-Operative and Postpartum: none Hemoglobin  Date Value Ref Range Status  09/27/2016 8.6 (L) 12.0 - 15.0 g/dL Final   HCT  Date Value Ref Range Status  09/27/2016 25.4 (L) 36.0 - 46.0 % Final    Physical Exam:  General: alert, cooperative and no distress Lochia: appropriate Uterine Fundus: firm perineum: healing well, no significant drainage, no dehiscence DVT Evaluation: No evidence of DVT seen on physical exam. Negative Homan's sign. No cords or calf tenderness. No significant calf/ankle edema.  Discharge Diagnoses: Term Pregnancy-delivered  Discharge Information: Date: 09/28/2016 Activity: pelvic rest Diet: routine Medications: PNV and Ibuprofen Condition: stable Instructions: refer to practice specific booklet Discharge to: home   Newborn Data: Live born female  Birth Weight: 6 lb 11.1 oz (3035 g) APGAR: 8, 9  Home with mother.  Cynthia Logan, Cynthia Logan 09/28/2016, 9:06 AM

## 2016-09-28 NOTE — Progress Notes (Signed)
Post Partum Day 2 Subjective: no complaints, up ad lib, voiding, tolerating PO, + flatus and breast feeding  Objective: Blood pressure 131/73, pulse 94, temperature 98.5 F (36.9 C), resp. rate 18, height 5' (1.524 m), weight 90.7 kg (200 lb), SpO2 98 %, unknown if currently breastfeeding.  Physical Exam:  General: alert, cooperative and no distress Lochia: appropriate Uterine Fundus: firm perineum: healing well, no significant drainage, no dehiscence DVT Evaluation: No evidence of DVT seen on physical exam. Negative Homan's sign. No cords or calf tenderness. No significant calf/ankle edema.   Recent Labs  09/26/16 0140 09/27/16 0615  HGB 9.8* 8.6*  HCT 29.4* 25.4*    Assessment/Plan: Discharge home and Breastfeeding   LOS: 2 days   Cynthia Logan Cynthia Logan 09/28/2016, 9:03 AM

## 2019-04-03 LAB — OB RESULTS CONSOLE RPR: RPR: NONREACTIVE

## 2019-04-03 LAB — OB RESULTS CONSOLE RUBELLA ANTIBODY, IGM: Rubella: NON-IMMUNE/NOT IMMUNE

## 2019-04-03 LAB — OB RESULTS CONSOLE HEPATITIS B SURFACE ANTIGEN: Hepatitis B Surface Ag: NEGATIVE

## 2019-04-03 LAB — OB RESULTS CONSOLE HIV ANTIBODY (ROUTINE TESTING): HIV: NONREACTIVE

## 2019-06-19 LAB — OB RESULTS CONSOLE RPR: RPR: NONREACTIVE

## 2019-08-16 NOTE — L&D Delivery Note (Signed)
Delivery Note Cynthia Logan is a [id number] at [redacted]w[redacted]d who had a spontaneous delivery at 1827 on 09/04/19   a viable female was delivered via LOA.  APGAR: 8,9; weight 3856g (8lb8oz).    Admitted for labor, found to have severe GHTN. Progressed rapidly. Pushed for 2 minutes. Baby was delivered without difficulty. One nuchal cord easily reduced. Delayed cord clamping for 60 seconds. Delivery of placenta was spontaneous. Placenta was found to be intact 3 -vessel cord was noted. The fundus was found to be firm. 1st degree perineal laceration was repaired in the normal sterile fashion with 3-0 vicryl and 10cc of lidocaine. Estimated blood loss 50cc. Instrument and gauze counts were correct at the end of the procedure. Did not receive an epidural for pain management.  Mom to postpartum.  Baby to Couplet care / Skin to Skin.  Cynthia Logan 09/04/2019, 6:51 PM

## 2019-08-23 ENCOUNTER — Other Ambulatory Visit: Payer: Self-pay

## 2019-08-23 ENCOUNTER — Inpatient Hospital Stay (HOSPITAL_COMMUNITY)
Admission: AD | Admit: 2019-08-23 | Discharge: 2019-08-24 | Disposition: A | Discharge status: Home / Self Care | Payer: Medicaid Other | Attending: Obstetrics & Gynecology | Admitting: Obstetrics & Gynecology

## 2019-08-23 ENCOUNTER — Encounter (HOSPITAL_COMMUNITY): Payer: Self-pay | Admitting: Obstetrics & Gynecology

## 2019-08-23 DIAGNOSIS — O368130 Decreased fetal movements, third trimester, not applicable or unspecified: Secondary | ICD-10-CM

## 2019-08-23 DIAGNOSIS — O23593 Infection of other part of genital tract in pregnancy, third trimester: Secondary | ICD-10-CM | POA: Diagnosis not present

## 2019-08-23 DIAGNOSIS — N760 Acute vaginitis: Secondary | ICD-10-CM

## 2019-08-23 DIAGNOSIS — O26893 Other specified pregnancy related conditions, third trimester: Secondary | ICD-10-CM

## 2019-08-23 DIAGNOSIS — Z3A36 36 weeks gestation of pregnancy: Secondary | ICD-10-CM | POA: Diagnosis not present

## 2019-08-23 DIAGNOSIS — Z3689 Encounter for other specified antenatal screening: Secondary | ICD-10-CM

## 2019-08-23 DIAGNOSIS — Z7982 Long term (current) use of aspirin: Secondary | ICD-10-CM | POA: Insufficient documentation

## 2019-08-23 DIAGNOSIS — Z79899 Other long term (current) drug therapy: Secondary | ICD-10-CM | POA: Diagnosis not present

## 2019-08-23 DIAGNOSIS — M549 Dorsalgia, unspecified: Secondary | ICD-10-CM | POA: Diagnosis not present

## 2019-08-23 DIAGNOSIS — B9689 Other specified bacterial agents as the cause of diseases classified elsewhere: Secondary | ICD-10-CM

## 2019-08-23 LAB — WET PREP, GENITAL
Sperm: NONE SEEN
Trich, Wet Prep: NONE SEEN
Yeast Wet Prep HPF POC: NONE SEEN

## 2019-08-23 LAB — URINALYSIS, ROUTINE W REFLEX MICROSCOPIC
Bilirubin Urine: NEGATIVE
Glucose, UA: NEGATIVE mg/dL
Hgb urine dipstick: NEGATIVE
Ketones, ur: 5 mg/dL — AB
Leukocytes,Ua: NEGATIVE
Nitrite: NEGATIVE
Protein, ur: 30 mg/dL — AB
Specific Gravity, Urine: 1.019 (ref 1.005–1.030)
pH: 6 (ref 5.0–8.0)

## 2019-08-23 MED ORDER — CYCLOBENZAPRINE HCL 10 MG PO TABS
10.0000 mg | ORAL_TABLET | Freq: Once | ORAL | Status: AC
  Administered 2019-08-23: 23:00:00 10 mg via ORAL
  Filled 2019-08-23: qty 1

## 2019-08-23 NOTE — MAU Note (Signed)
PT SAYS SHE WAS IN OFFICE  LAST WED - NO VE.  DENIES HSV AND MRSA.Marland Kitchen GBS-  NOT COLLECTED. FEELING  PRESSURE IN LOWER ABD AND BACK HURTS.

## 2019-08-23 NOTE — MAU Provider Note (Signed)
History     CSN: [id number]  Arrival date and time: 08/23/19 2212   First Provider Initiated Contact with Patient 08/23/19 2313      Chief Complaint  Patient presents with  . Back Pain  . Decreased Fetal Movement   Cynthia Logan is a 39 y.o. [id number] at [redacted]w[redacted]d who receives care at [facility].  She presents today for Back Pain and Decreased Fetal Movement.  She states she has been experiencing back pain throughout the pregnancy, but today it has gotten worse.  She states she has not taken anything for the pain and rates it a 7/10.  She also reports some decreased fetal movement stating earlier today "he wasn't moving as much."  However, patient endorses perception of movement now.  Patient also reports some vaginal discharge, that she thought may have been fluid leaking.  She states she noticed the increased discharge around 3-4pm and did not require usage of a pad, did not notice any apparent odor, and states it was a clear milky color.  Patient denies sexual activity in the last 3 days.  She endorses contractions, but states they "are small at the bottom of my stomach."    OB History    Gravida  7   Para  3   Term  3   Preterm  0   AB  3   Living  3     SAB  3   TAB  0   Ectopic  0   Multiple  0   Live Births  3           Past Medical History:  Diagnosis Date  . Medical history non-contributory   . Vaginal Pap smear, abnormal     Past Surgical History:  Procedure Laterality Date  . COLPOSCOPY    . NO PAST SURGERIES      Family History  Problem Relation Age of Onset  . Alcohol abuse Neg Hx     Social History   Tobacco Use  . Smoking status: Never Smoker  . Smokeless tobacco: Never Used  Substance Use Topics  . Alcohol use: Yes    Comment: social but not with pregnancy  . Drug use: No    Allergies: No Known Allergies  Medications Prior to Admission  Medication Sig Dispense Refill Last Dose  . aspirin EC 81 MG tablet Take 81 mg by  mouth daily.   08/23/2019 at Unknown time  . Prenatal Vit-Fe Fumarate-FA (PRENATAL MULTIVITAMIN) TABS tablet Take 1 tablet by mouth daily at 12 noon.   08/23/2019 at Unknown time  . acetaminophen (TYLENOL) 500 MG tablet Take 500 mg by mouth every 6 (six) hours as needed for mild pain.   More than a month at Unknown time    Review of Systems  Constitutional: Negative for chills and fever.  Eyes: Negative for visual disturbance.  Respiratory: Negative for cough and shortness of breath.   Gastrointestinal: Negative for abdominal pain, constipation, diarrhea, nausea and vomiting.  Genitourinary: Positive for pelvic pain (Pressure) and vaginal discharge. Negative for difficulty urinating, dysuria and vaginal bleeding.  Musculoskeletal: Positive for back pain.  Neurological: Negative for dizziness, light-headedness and headaches.   Physical Exam   Blood pressure 133/84, pulse (!) 107, temperature 97.8 F (36.6 C), resp. rate 18, height 5\' 2"  (1.575 m), weight 96.2 kg, unknown if currently breastfeeding.  Physical Exam  Constitutional: She appears well-developed and well-nourished.  HENT:  Head: Normocephalic and atraumatic.  Eyes: Conjunctivae are normal.  Cardiovascular: Normal rate, regular rhythm and normal heart sounds.  Respiratory: Effort normal.  GI: Soft. There is no abdominal tenderness.  Genitourinary:    Genitourinary Comments: GC/CT and Wet Prep collected via Blind Swab by Nurse.   Musculoskeletal:        General: No edema. Normal range of motion.     Cervical back: Normal range of motion.  Neurological: She is alert.  Skin: Skin is warm and dry.  Psychiatric: She has a normal mood and affect. Her behavior is normal.    Fetal Assessment 145 bpm, Mod Var, -Decels, +Accels Toco: Irritability  MAU Course   Results for orders placed or performed during the hospital encounter of 08/23/19 (from the past 24 hour(s))  Urinalysis, Routine w reflex microscopic     Status: Abnormal    Collection Time: 08/23/19 10:27 PM  Result Value Ref Range   Color, Urine YELLOW YELLOW   APPearance CLOUDY (A) CLEAR   Specific Gravity, Urine 1.019 1.005 - 1.030   pH 6.0 5.0 - 8.0   Glucose, UA NEGATIVE NEGATIVE mg/dL   Hgb urine dipstick NEGATIVE NEGATIVE   Bilirubin Urine NEGATIVE NEGATIVE   Ketones, ur 5 (A) NEGATIVE mg/dL   Protein, ur 30 (A) NEGATIVE mg/dL   Nitrite NEGATIVE NEGATIVE   Leukocytes,Ua NEGATIVE NEGATIVE   RBC / HPF 0-5 0 - 5 RBC/hpf   WBC, UA 0-5 0 - 5 WBC/hpf   Bacteria, UA RARE (A) NONE SEEN   Squamous Epithelial / LPF 6-10 0 - 5   Mucus PRESENT   Wet prep, genital     Status: Abnormal   Collection Time: 08/23/19 11:05 PM   Specimen: PATH Cytology Cervicovaginal Ancillary Only  Result Value Ref Range   Yeast Wet Prep HPF POC NONE SEEN NONE SEEN   Trich, Wet Prep NONE SEEN NONE SEEN   Clue Cells Wet Prep HPF POC PRESENT (A) NONE SEEN   WBC, Wet Prep HPF POC MODERATE (A) NONE SEEN   Sperm NONE SEEN    No results found.  MDM PE Labs: Wet Prep, GC/CT  EFM Pain Medication  Assessment and Plan  39 year old [id number]  SIUP at 36weeks Cat I FT Back Pain  -Exam findings discussed. -Discussed usage of muscle relaxant for back pain. -Patient agreeable and given Flexeril 10mg . -Will send cultures. -Will reassess.   MSN, CNM 08/23/2019, 11:13 PM   Reassessment (12:18 AM)  -Patient reports back pain has improved with Flexeril dosing. -Informed of wet prep findings significant for bacterial vaginosis.  -Patient reports history and prefers oral treatment.  -Rx for Flagyl 500mg  BID sent to pharmacy on file.  -Will also send script for flexeril. -Discussed usage of tylenol and warm compresses for back pain prior to usage of Flexeril. -Patient verbalizes understanding and has no questions or concerns. -Encouraged to call or return to MAU if symptoms worsen or with the onset of new symptoms. -Discharged to home in improved  condition.  10/21/2019 MSN, CNM Advanced Practice Provider, [facility] 

## 2019-08-23 NOTE — MAU Note (Signed)
I have been having a lot of pelvic pressure but today has been really uncomfortable. My lower back is very painful. Baby not moving as much as usual today. Denies vag bleeding and no LOF. More vag d/c today.

## 2019-08-24 MED ORDER — CYCLOBENZAPRINE HCL 10 MG PO TABS: 10.0000 mg | ORAL_TABLET | Freq: Two times a day (BID) | ORAL | 0 refills | Status: DC | PRN

## 2019-08-24 MED ORDER — METRONIDAZOLE 500 MG PO TABS: 500.0000 mg | ORAL_TABLET | Freq: Two times a day (BID) | ORAL | 0 refills | Status: DC

## 2019-08-24 NOTE — Discharge Instructions (Signed)

## 2019-08-26 LAB — GC/CHLAMYDIA PROBE AMP (~~LOC~~) NOT AT ARMC
Chlamydia: NEGATIVE
Comment: NEGATIVE
Comment: NORMAL
Neisseria Gonorrhea: NEGATIVE

## 2019-08-28 LAB — OB RESULTS CONSOLE GBS: GBS: NEGATIVE

## 2019-09-04 ENCOUNTER — Encounter (HOSPITAL_COMMUNITY): Payer: Self-pay | Admitting: Obstetrics & Gynecology

## 2019-09-04 ENCOUNTER — Inpatient Hospital Stay (HOSPITAL_COMMUNITY)
Admission: AD | Admit: 2019-09-04 | Discharge: 2019-09-06 | DRG: 807 | Disposition: A | Discharge status: Home / Self Care | Payer: Medicaid Other | Attending: Obstetrics & Gynecology | Admitting: Obstetrics & Gynecology

## 2019-09-04 ENCOUNTER — Other Ambulatory Visit: Payer: Self-pay

## 2019-09-04 DIAGNOSIS — O99214 Obesity complicating childbirth: Secondary | ICD-10-CM | POA: Diagnosis present

## 2019-09-04 DIAGNOSIS — O163 Unspecified maternal hypertension, third trimester: Secondary | ICD-10-CM | POA: Diagnosis not present

## 2019-09-04 DIAGNOSIS — Z3A37 37 weeks gestation of pregnancy: Secondary | ICD-10-CM | POA: Diagnosis not present

## 2019-09-04 DIAGNOSIS — E669 Obesity, unspecified: Secondary | ICD-10-CM | POA: Diagnosis present

## 2019-09-04 DIAGNOSIS — O134 Gestational [pregnancy-induced] hypertension without significant proteinuria, complicating childbirth: Principal | ICD-10-CM | POA: Diagnosis present

## 2019-09-04 DIAGNOSIS — Z20822 Contact with and (suspected) exposure to covid-19: Secondary | ICD-10-CM | POA: Diagnosis present

## 2019-09-04 DIAGNOSIS — O26893 Other specified pregnancy related conditions, third trimester: Secondary | ICD-10-CM | POA: Diagnosis present

## 2019-09-04 LAB — COMPREHENSIVE METABOLIC PANEL WITH GFR
ALT: 22 U/L (ref 0–44)
AST: 33 U/L (ref 15–41)
Albumin: 2.8 g/dL — ABNORMAL LOW (ref 3.5–5.0)
Alkaline Phosphatase: 152 U/L — ABNORMAL HIGH (ref 38–126)
Anion gap: 12 (ref 5–15)
BUN: 9 mg/dL (ref 6–20)
CO2: 17 mmol/L — ABNORMAL LOW (ref 22–32)
Calcium: 9.1 mg/dL (ref 8.9–10.3)
Chloride: 107 mmol/L (ref 98–111)
Creatinine, Ser: 0.67 mg/dL (ref 0.44–1.00)
GFR calc Af Amer: >60 mL/min (ref >60)
GFR calc non Af Amer: >60 mL/min (ref >60)
Glucose, Bld: 106 mg/dL — ABNORMAL HIGH (ref 70–99)
Potassium: 4.1 mmol/L (ref 3.5–5.1)
Sodium: 136 mmol/L (ref 135–145)
Total Bilirubin: 0.6 mg/dL (ref 0.3–1.2)
Total Protein: 6.3 g/dL — ABNORMAL LOW (ref 6.5–8.1)

## 2019-09-04 LAB — TYPE AND SCREEN
ABO/RH(D): A POS
Antibody Screen: NEGATIVE

## 2019-09-04 LAB — CBC
HCT: 32.7 % — ABNORMAL LOW (ref 36.0–46.0)
Hemoglobin: 10.2 g/dL — ABNORMAL LOW (ref 12.0–15.0)
MCH: 29 pg (ref 26.0–34.0)
MCHC: 31.2 g/dL (ref 30.0–36.0)
MCV: 92.9 fL (ref 80.0–100.0)
Platelets: 198 10*3/uL (ref 150–400)
RBC: 3.52 MIL/uL — ABNORMAL LOW (ref 3.87–5.11)
RDW: 15.1 % (ref 11.5–15.5)
WBC: 9.1 10*3/uL (ref 4.0–10.5)
nRBC: 0.3 % — ABNORMAL HIGH (ref 0.0–0.2)

## 2019-09-04 LAB — RESPIRATORY PANEL BY RT PCR (FLU A&B, COVID)
Influenza A by PCR: NEGATIVE
Influenza B by PCR: NEGATIVE
SARS Coronavirus 2 by RT PCR: NEGATIVE

## 2019-09-04 LAB — ABO/RH: ABO/RH(D): A POS

## 2019-09-04 MED ORDER — ONDANSETRON HCL 4 MG PO TABS: 4.0000 mg | ORAL_TABLET | ORAL | Status: DC | PRN

## 2019-09-04 MED ORDER — SOD CITRATE-CITRIC ACID 500-334 MG/5ML PO SOLN: 30.0000 mL | ORAL | Status: DC | PRN

## 2019-09-04 MED ORDER — LABETALOL HCL 5 MG/ML IV SOLN: 80.0000 mg | INTRAVENOUS | Status: DC | PRN

## 2019-09-04 MED ORDER — OXYCODONE-ACETAMINOPHEN 5-325 MG PO TABS: 2.0000 | ORAL_TABLET | ORAL | Status: DC | PRN

## 2019-09-04 MED ORDER — LABETALOL HCL 5 MG/ML IV SOLN
20.0000 mg | INTRAVENOUS | Status: DC | PRN
  Administered 2019-09-04: 20 mg via INTRAVENOUS
  Filled 2019-09-04: qty 4

## 2019-09-04 MED ORDER — LACTATED RINGERS IV SOLN: INTRAVENOUS | Status: DC

## 2019-09-04 MED ORDER — OXYCODONE-ACETAMINOPHEN 5-325 MG PO TABS: 1.0000 | ORAL_TABLET | ORAL | Status: DC | PRN

## 2019-09-04 MED ORDER — IBUPROFEN 600 MG PO TABS
600.0000 mg | ORAL_TABLET | Freq: Four times a day (QID) | ORAL | Status: DC
  Administered 2019-09-04 – 2019-09-06 (×6): 600 mg via ORAL
  Filled 2019-09-04 (×6): qty 1

## 2019-09-04 MED ORDER — OXYTOCIN BOLUS FROM INFUSION
500.0000 mL | Freq: Once | INTRAVENOUS | Status: AC
  Administered 2019-09-04: 500 mL via INTRAVENOUS

## 2019-09-04 MED ORDER — LABETALOL HCL 5 MG/ML IV SOLN: 20.0000 mg | INTRAVENOUS | Status: DC | PRN

## 2019-09-04 MED ORDER — OXYTOCIN BOLUS FROM INFUSION: 500.0000 mL | Freq: Once | INTRAVENOUS | Status: DC

## 2019-09-04 MED ORDER — ONDANSETRON HCL 4 MG/2ML IJ SOLN: 4.0000 mg | INTRAMUSCULAR | Status: DC | PRN

## 2019-09-04 MED ORDER — SODIUM CHLORIDE 0.9% FLUSH: 3.0000 mL | INTRAVENOUS | Status: DC | PRN

## 2019-09-04 MED ORDER — MAGNESIUM SULFATE 40 GM/1000ML IV SOLN
2.0000 g/h | INTRAVENOUS | Status: DC
  Filled 2019-09-04: qty 1000

## 2019-09-04 MED ORDER — ACETAMINOPHEN 325 MG PO TABS: 650.0000 mg | ORAL_TABLET | ORAL | Status: DC | PRN

## 2019-09-04 MED ORDER — ONDANSETRON HCL 4 MG/2ML IJ SOLN: 4.0000 mg | Freq: Four times a day (QID) | INTRAMUSCULAR | Status: DC | PRN

## 2019-09-04 MED ORDER — FENTANYL CITRATE (PF) 100 MCG/2ML IJ SOLN
INTRAMUSCULAR | Status: AC
  Filled 2019-09-04: qty 2

## 2019-09-04 MED ORDER — HYDRALAZINE HCL 20 MG/ML IJ SOLN: 10.0000 mg | INTRAMUSCULAR | Status: DC | PRN

## 2019-09-04 MED ORDER — COCONUT OIL OIL: 1.0000 "application " | TOPICAL_OIL | Status: DC | PRN

## 2019-09-04 MED ORDER — TETANUS-DIPHTH-ACELL PERTUSSIS 5-2.5-18.5 LF-MCG/0.5 IM SUSP: 0.5000 mL | Freq: Once | INTRAMUSCULAR | Status: DC

## 2019-09-04 MED ORDER — OXYTOCIN 40 UNITS IN NORMAL SALINE INFUSION - SIMPLE MED: 2.5000 [IU]/h | INTRAVENOUS | Status: DC

## 2019-09-04 MED ORDER — LIDOCAINE HCL (PF) 1 % IJ SOLN: 30.0000 mL | INTRAMUSCULAR | Status: DC | PRN

## 2019-09-04 MED ORDER — LABETALOL HCL 5 MG/ML IV SOLN: 40.0000 mg | INTRAVENOUS | Status: DC | PRN

## 2019-09-04 MED ORDER — OXYTOCIN 40 UNITS IN NORMAL SALINE INFUSION - SIMPLE MED
2.5000 [IU]/h | INTRAVENOUS | Status: DC
  Filled 2019-09-04: qty 1000

## 2019-09-04 MED ORDER — MAGNESIUM SULFATE BOLUS VIA INFUSION
4.0000 g | Freq: Once | INTRAVENOUS | Status: DC
  Filled 2019-09-04: qty 1000

## 2019-09-04 MED ORDER — DIBUCAINE (PERIANAL) 1 % EX OINT: 1.0000 "application " | TOPICAL_OINTMENT | CUTANEOUS | Status: DC | PRN

## 2019-09-04 MED ORDER — LACTATED RINGERS IV SOLN: 500.0000 mL | INTRAVENOUS | Status: DC | PRN

## 2019-09-04 MED ORDER — OXYCODONE HCL 5 MG PO TABS: 10.0000 mg | ORAL_TABLET | ORAL | Status: DC | PRN

## 2019-09-04 MED ORDER — SODIUM CHLORIDE 0.9% FLUSH
3.0000 mL | Freq: Two times a day (BID) | INTRAVENOUS | Status: DC
  Administered 2019-09-05: 3 mL via INTRAVENOUS

## 2019-09-04 MED ORDER — SODIUM CHLORIDE 0.9 % IV SOLN: 250.0000 mL | INTRAVENOUS | Status: DC | PRN

## 2019-09-04 MED ORDER — MEASLES, MUMPS & RUBELLA VAC IJ SOLR: 0.5000 mL | Freq: Once | INTRAMUSCULAR | Status: DC

## 2019-09-04 MED ORDER — SIMETHICONE 80 MG PO CHEW: 80.0000 mg | CHEWABLE_TABLET | ORAL | Status: DC | PRN

## 2019-09-04 MED ORDER — BENZOCAINE-MENTHOL 20-0.5 % EX AERO: 1.0000 "application " | INHALATION_SPRAY | CUTANEOUS | Status: DC | PRN

## 2019-09-04 MED ORDER — DOCUSATE SODIUM 100 MG PO CAPS
100.0000 mg | ORAL_CAPSULE | Freq: Two times a day (BID) | ORAL | Status: DC
  Administered 2019-09-04 – 2019-09-05 (×3): 100 mg via ORAL
  Filled 2019-09-04 (×4): qty 1

## 2019-09-04 MED ORDER — PRENATAL MULTIVITAMIN CH
1.0000 | ORAL_TABLET | Freq: Every day | ORAL | Status: DC
  Administered 2019-09-05: 1 via ORAL
  Filled 2019-09-04: qty 1

## 2019-09-04 MED ORDER — LIDOCAINE HCL (PF) 1 % IJ SOLN
30.0000 mL | INTRAMUSCULAR | Status: DC | PRN
  Filled 2019-09-04: qty 30

## 2019-09-04 MED ORDER — WITCH HAZEL-GLYCERIN EX PADS: 1.0000 "application " | MEDICATED_PAD | CUTANEOUS | Status: DC | PRN

## 2019-09-04 MED ORDER — OXYCODONE HCL 5 MG PO TABS: 5.0000 mg | ORAL_TABLET | ORAL | Status: DC | PRN

## 2019-09-04 MED ORDER — DIPHENHYDRAMINE HCL 25 MG PO CAPS: 25.0000 mg | ORAL_CAPSULE | Freq: Four times a day (QID) | ORAL | Status: DC | PRN

## 2019-09-04 NOTE — MAU Note (Signed)
.  Cynthia Logan is a 39 y.o. at [redacted]w[redacted]d here in MAU reporting: contractions throughout the day and have gotten progressively worse. Denies any VB or LOF  Onset of complaint: today Pain score: 7 There were no vitals filed for this visit.   FHT:145 Lab orders placed from triage: UA

## 2019-09-04 NOTE — Lactation Note (Signed)
This note was copied from a baby's chart. Lactation Consultation Note Baby 4 hrs old. Mom's 4th child. Mom has a 38 yr old, 24 yr old and a 91 yr old. Mom BF them for 12-18 months. Mom stated she never had to give formula. Mom hand expressed colostrum easily. Mom has large everted nipples. Baby latches well. Mom latched on cradle position. Adjusted body position. Reviewed proper body alignment, support, positions, and props. Discussed cramping and breast massage. Discussed 37 week baby, STS, I&O, and feeding habits. Mom encouraged to feed baby 8-12 times/24 hours and with feeding cues. Mom encouraged to waken baby for feeding if baby hasn't cued to feed in 3 hrs. Encouraged mom to call for assistance or concerns.   Patient Name: Cynthia Logan [id number] Date: 09/04/2019 Reason for consult: Initial assessment;Early term 37-38.6wks   Maternal Data Has patient been taught Hand Expression?: Yes Does the patient have breastfeeding experience prior to this delivery?: Yes  Feeding Feeding Type: Breast Fed  LATCH Score Latch: Grasps breast easily, tongue down, lips flanged, rhythmical sucking.  Audible Swallowing: Spontaneous and intermittent  Type of Nipple: Everted at rest and after stimulation  Comfort (Breast/Nipple): Soft / non-tender  Hold (Positioning): Assistance needed to correctly position infant at breast and maintain latch.(adjusted body position)  LATCH Score: 9  Interventions Interventions: Breast feeding basics reviewed;Support pillows;Position options;Skin to skin;Breast massage;Breast compression;Adjust position  Lactation Tools Discussed/Used WIC Program: Yes   Consult Status Consult Status: Follow-up Date: 09/05/19 Follow-up type: In-patient    Charyl Dancer 09/04/2019, 11:26 PM

## 2019-09-04 NOTE — Progress Notes (Signed)
OBGYN Note Prior to delivery I counseled patient on the diagnosis of severe gestational hypertension and recommendation for magnesium for seizure prophylaxis. When magnesium was going to be initiated patient was ready to push for delivery. We planned to then start magnesium after baby delivered. Patient refused magnesium at this time, I counseled her again on the diagnosis and risk of progression of disease and importance of magnesium for seizure prophylaxis. Discussed that there is also the risk of delaying her discharge from the hospital and readmission due to progression of disease. Patient continued to refused, reporting she wanted to avoid medication in order to be with her baby.  -Will continue to closely monitor patient's blood pressures and symptoms of preeclampsia.  Natlie Asfour K Taam-Akelman 09/04/19 7:04 PM

## 2019-09-04 NOTE — Plan of Care (Signed)
L&D Care plan complete

## 2019-09-04 NOTE — H&P (Signed)
Cynthia Logan is a 39 y.o. female [phone] 37w5dpresenting for labor. She reports no LOF, VB. Reports regular contractions. Normal FM. Denies HA/VC/RUQ pain/SOB  Pregnancy complicated by: 1. AMA: low fetal fraction on panorama, considered high risk for triploidy, tri 18 and 13. Seen by MFM. Normal anatomy scan. Last growth scan 08/13/2019 EFW 3224g 89%, AC >97% 2. Obesity 3. History of GHTN  OB History    Gravida  7   Para  3   Term  3   Preterm  0   AB  3   Living  3     SAB  3   TAB  0   Ectopic  0   Multiple  0   Live Births  3          Past Medical History:  Diagnosis Date  . Medical history non-contributory   . Vaginal Pap smear, abnormal    Past Surgical History:  Procedure Laterality Date  . COLPOSCOPY    . NO PAST SURGERIES     Family History: family history is not on file. Social History:  reports that she has never smoked. She has never used smokeless tobacco. She reports current alcohol use. She reports that she does not use drugs.     Maternal Diabetes: No Genetic Screening: Abnormal  low fetal fraction on panorama, considered high risk for triploidy, tri 18 and 13. Seen by MFM. Normal anatomy scan Maternal Ultrasounds/Referrals: Normal anatomy scan. Posterior placenta Fetal Ultrasounds or other Referrals:  Referred to Materal Fetal Medicine  Maternal Substance Abuse:  No Significant Maternal Medications:  None Significant Maternal Lab Results:  Group B Strep negative Other Comments:  None  Review of Systems Per HPI Exam Physical Exam  Dilation: 6 Effacement (%): 90 Station: -3 Exam by:: HHarley-DavidsonBlood pressure (!) 147/86, pulse (!) 102, temperature 98.6 F (37 C), temperature source Oral, resp. rate 18, height _0  (1.575 m), weight 97 kg, SpO2 98 %, unknown if currently breastfeeding. Patient Vitals for the past 24 hrs:  BP Temp Temp src Pulse Resp SpO2 Height Weight  09/04/19 1722 (!) 147/86 98.6 F (37 C) Oral (!) 102 18  -- _1  (1.575 m) 97 kg  09/04/19 1646 (!) 138/108 -- -- 97 -- 98 % -- --  09/04/19 1631 (!) 147/88 -- -- (!) 103 -- 99 % -- --  09/04/19 1624 (!) 166/99 -- -- (!) 104 -- -- -- --  09/04/19 1601 -- -- -- (!) 119 -- -- -- --  09/04/19 1600 (!) 160/101 98 F (36.7 C) -- (!) 120 18 99 % -- 97 kg  09/04/19 1558 (!) 149/101 -- -- (!) 121 -- -- -- --   Painful with contractions Gravid abdomen Fetal testing: FHR 150, +accels, no decels. Cat 1 Toco q349mPrenatal labs: ABO, Rh:  --/--/A POS, A POS [place]formed [place] [facility] [place] [id number]([id number]/20 1615) Antibody: NEG (01/20 1615) Rubella: Nonimmune (08/19 0000) RPR: Nonreactive (11/04 0000)  HBsAg: Negative (08/19 0000)  HIV: Non-reactive (08/19 0000)  GBS: Negative/-- (01/13 0000)  Recent Labs    09/04/19 1611  WBC 9.1  HGB 10.2*  HCT 32.7*  PLT 198  NA 136  K 4.1  CL 107  BUN 9  CREATININE 0.67  AST 33  ALT 22  BILITOT 0.6    Assessment/Plan: HeCalyssa Zobristsim [age].o. [id number] 3727w5dre with labor, diagnosed with severe GHTN 1.  Labor: SVE changed from 3.5 to 6/90 in MAU. Will augment prn. Desires natural labor.  2. Severe GHTN: severe BP in MAU, received IV Labetalol. Asymptomatic and preeclampsia labs: CBC/CMP wnl, UPC pending. Will start magnesium for seizure prophylaxis.  3. AMA: low fetal fraction on panorama, considered high risk for triploidy, tri 18 and 13. Seen by MFM. Normal anatomy scan. Last growth scan 08/13/2019 EFW 3224g 89%, AC >97% 4. Obesity: BMI 39 5. Rubella nonimmune: plan for MMR  Shaquetta Arcos K Taam-Akelman 09/04/2019, 6:02 PM

## 2019-09-04 NOTE — Progress Notes (Signed)
Informed patient that I would be starting magnesium per MD order. Patient asked if the medication was necessary. Dr. Claudine Mouton called to discuss medication. Magnesium was not started at this time per patient request.

## 2019-09-04 NOTE — MAU Provider Note (Signed)
Chief Complaint:  Contractions   None     HPI: Cynthia Logan is a 39 y.o. [phone] at [redacted]w[redacted]d who presents to maternity admissions reporting painful contractions that started today. They are in her abdomen and back, intermittent, and becoming stronger and closer together. She has hx of HTN in previous pregnancy but no HTN this pregnancy. She is taking a daily baby ASA. She has not tried any treatments for her symptoms, there are no other symptoms.  She reports good fetal movement.  HPI  Past Medical History: Past Medical History:  Diagnosis Date  . Medical history non-contributory   . Vaginal Pap smear, abnormal     Past obstetric history: OB History  Gravida Para Term Preterm AB Living  7 3 3  0 3 3  SAB TAB Ectopic Multiple Live Births  3 0 0 0 3    # Outcome Date GA Lbr Len/2nd Weight Sex Delivery Anes PTL Lv  7 Current           6 Term 09/26/16 [redacted]w[redacted]d 00:43 / 00:11 3035 g F Vag-Spont None  LIV  5 SAB           4 SAB           3 SAB           2 Term      Vag-Spont     1 Term      Vag-Spont       Past Surgical History: Past Surgical History:  Procedure Laterality Date  . COLPOSCOPY    . NO PAST SURGERIES      Family History: Family History  Problem Relation Age of Onset  . Alcohol abuse Neg Hx     Social History: Social History   Tobacco Use  . Smoking status: Never Smoker  . Smokeless tobacco: Never Used  Substance Use Topics  . Alcohol use: Yes    Comment: social but not with pregnancy  . Drug use: No    Allergies: No Known Allergies  Meds:  Medications Prior to Admission  Medication Sig Dispense Refill Last Dose  . acetaminophen (TYLENOL) 500 MG tablet Take 500 mg by mouth every 6 (six) hours as needed for mild pain.   09/03/2019 at Unknown time  . aspirin EC 81 MG tablet Take 81 mg by mouth daily.   09/04/2019 at Unknown time  . cyclobenzaprine (FLEXERIL) 10 MG tablet Take 1 tablet (10 mg total) by mouth 2 (two) times daily as needed for muscle  spasms. 10 tablet 0 Past Week at Unknown time  . Prenatal Vit-Fe Fumarate-FA (PRENATAL MULTIVITAMIN) TABS tablet Take 1 tablet by mouth daily at 12 noon.   09/03/2019 at Unknown time  . metroNIDAZOLE (FLAGYL) 500 MG tablet Take 1 tablet (500 mg total) by mouth 2 (two) times daily. 14 tablet 0     ROS:  Review of Systems  Constitutional: Negative for chills, fatigue and fever.  Eyes: Negative for visual disturbance.  Respiratory: Negative for shortness of breath.   Cardiovascular: Negative for chest pain.  Gastrointestinal: Positive for abdominal pain. Negative for nausea and vomiting.  Genitourinary: Positive for pelvic pain. Negative for difficulty urinating, dysuria, flank pain, vaginal bleeding, vaginal discharge and vaginal pain.  Musculoskeletal: Positive for back pain.  Neurological: Negative for dizziness and headaches.  Psychiatric/Behavioral: Negative.      I have reviewed patient's Past Medical Hx, Surgical Hx, Family Hx, Social Hx, medications and allergies.   Physical Exam   Patient Vitals for  the past 24 hrs:  BP Temp Pulse Resp SpO2 Weight  09/04/19 1646 (!) 138/108 -- 97 -- 98 % --  09/04/19 1631 (!) 147/88 -- (!) 103 -- 99 % --  09/04/19 1624 (!) 166/99 -- (!) 104 -- -- --  09/04/19 1601 -- -- (!) 119 -- -- --  09/04/19 1600 (!) 160/101 98 F (36.7 C) (!) 120 18 99 % 97 kg  09/04/19 1558 (!) 149/101 -- (!) 121 -- -- --   Constitutional: Well-developed, well-nourished female in no acute distress.  Cardiovascular: normal rate Respiratory: normal effort GI: Abd soft, non-tender, gravid appropriate for gestational age.  MS: Extremities nontender, no edema, normal ROM Neurologic: Alert and oriented x 4.  GU: Neg CVAT.  PELVIC EXAM: Cervix pink, visually closed, without lesion, scant white creamy discharge, vaginal walls and external genitalia normal Bimanual exam: Cervix 0/long/high, firm, anterior, neg CMT, uterus nontender, nonenlarged, adnexa without tenderness,  enlargement, or mass  Dilation: 6 Effacement (%): 90 Station: -3 Presentation: Vertex Exam by:: Holly Flippin RN   Cervix 6 with bulging bag of water on recheck by [employer]:  Baseline 150 , moderate variability, accelerations present, no decelerations Contractions: q 2-3 mins, moderate to palpation   Labs: Results for orders placed or performed during the hospital encounter of 09/04/19 (from the past 24 hour(s))  CBC     Status: Abnormal   Collection Time: 09/04/19  4:11 PM  Result Value Ref Range   WBC 9.1 4.0 - 10.5 K/uL   RBC 3.52 (L) 3.87 - 5.11 MIL/uL   Hemoglobin 10.2 (L) 12.0 - 15.0 g/dL   HCT 97.3 (L) 53.2 - 99.2 %   MCV 92.9 80.0 - 100.0 fL   MCH 29.0 26.0 - 34.0 pg   MCHC 31.2 30.0 - 36.0 g/dL   RDW 42.6 83.4 - 19.6 %   Platelets 198 150 - 400 K/uL   nRBC 0.3 (H) 0.0 - 0.2 %  Comprehensive metabolic panel     Status: Abnormal   Collection Time: 09/04/19  4:11 PM  Result Value Ref Range   Sodium 136 135 - 145 mmol/L   Potassium 4.1 3.5 - 5.1 mmol/L   Chloride 107 98 - 111 mmol/L   CO2 17 (L) 22 - 32 mmol/L   Glucose, Bld 106 (H) 70 - 99 mg/dL   BUN 9 6 - 20 mg/dL   Creatinine, Ser 2.22 0.44 - 1.00 mg/dL   Calcium 9.1 8.9 - 97.9 mg/dL   Total Protein 6.3 (L) 6.5 - 8.1 g/dL   Albumin 2.8 (L) 3.5 - 5.0 g/dL   AST 33 15 - 41 U/L   ALT 22 0 - 44 U/L   Alkaline Phosphatase 152 (H) 38 - 126 U/L   Total Bilirubin 0.6 0.3 - 1.2 mg/dL   GFR calc non Af Amer >60 >60 mL/min   GFR calc Af Amer >60 >60 mL/min   Anion gap 12 5 - 15      Imaging:  No results found.  MAU Course/MDM: Orders Placed This Encounter  Procedures  . OB RESULT CONSOLE Group B Strep  . Respiratory Panel by RT PCR (Flu A&B, Covid) - Nasopharyngeal Swab  . CBC  . Comprehensive metabolic panel  . Protein / creatinine ratio, urine  . OB RESULTS CONSOLE RPR  . OB RESULTS CONSOLE RPR  . OB RESULTS CONSOLE HIV antibody  . OB RESULTS CONSOLE Rubella Antibody  . OB RESULTS CONSOLE Hepatitis B  surface antigen  .  RPR  . CBC  . RPR  . Diet clear liquid Room service appropriate? Yes; Fluid consistency: Thin  . Contraction - monitoring  . External fetal heart monitoring  . Vaginal exam  . Notify Physician  . Measure blood pressure  . Vital signs  . Notify Physician  . Activity as tolerated  . Fetal monitoring per unit policy  . Cervical Exam  . Measure blood pressure post delivery every 15 min x 1 hour then every 30 min x 1 hour  . Fundal check post delivery every 15 min x 1 hour then every 30 min x 1 hour  . If Rapid HIV test positive or known HIV positive: initiate AZT orders  . May in and out cath x 2 for inability to void  . Insert foley catheter  . Discontinue foley prior to vaginal delivery  . Vital signs  . Notify Physician  . Activity as tolerated  . Fetal monitoring per unit policy  . Cervical Exam  . Measure blood pressure post delivery every 15 min x 1 hour then every 30 min x 1 hour  . Fundal check post delivery every 15 min x 1 hour then every 30 min x 1 hour  . If Rapid HIV test positive or known HIV positive: initiate AZT orders  . May in and out cath x 2 for inability to void  . Insert foley catheter  . Discontinue foley prior to vaginal delivery  . Full code  . Type and screen [place]  . Type and screen [place]  . Insert and maintain IV Line  . Insert and maintain IV Line  . Admit to Inpatient (patient's expected length of stay will be greater than 2 midnights or inpatient only procedure)  . Admit to Inpatient (patient's expected length of stay will be greater than 2 midnights or inpatient only procedure)    Meds ordered this encounter  Medications  . AND Linked Order Group   . labetalol (NORMODYNE) injection 20 mg   . labetalol (NORMODYNE) injection 40 mg   . labetalol (NORMODYNE) injection 80 mg   . hydrALAZINE (APRESOLINE) injection 10 mg  . lactated ringers infusion  . oxytocin (PITOCIN) IV BOLUS  FROM BAG  . oxytocin (PITOCIN) IV infusion 40 units in NS 1000 mL - Premix  . lactated ringers infusion 500-1,000 mL  . acetaminophen (TYLENOL) tablet 650 mg  . oxyCODONE-acetaminophen (PERCOCET/ROXICET) 5-325 MG per tablet 1 tablet  . oxyCODONE-acetaminophen (PERCOCET/ROXICET) 5-325 MG per tablet 2 tablet  . ondansetron (ZOFRAN) injection 4 mg  . sodium citrate-citric acid (ORACIT) solution 30 mL  . lidocaine (PF) (XYLOCAINE) 1 % injection 30 mL  . lactated ringers infusion  . oxytocin (PITOCIN) IV BOLUS FROM BAG  . oxytocin (PITOCIN) IV infusion 40 units in NS 1000 mL - Premix  . lactated ringers infusion 500-1,000 mL  . acetaminophen (TYLENOL) tablet 650 mg  . oxyCODONE-acetaminophen (PERCOCET/ROXICET) 5-325 MG per tablet 1 tablet  . oxyCODONE-acetaminophen (PERCOCET/ROXICET) 5-325 MG per tablet 2 tablet  . ondansetron (ZOFRAN) injection 4 mg  . sodium citrate-citric acid (ORACIT) solution 30 mL  . lidocaine (PF) (XYLOCAINE) 1 % injection 30 mL     NST reviewed and reactive, Category I FHR tracing in labor Active labor with cervical change in MAU BP elevated and protocol ordered with one dose of IV labetalol given and no additional severe range BP after medication Pt denies any s/sx of PEC RN to call [facility]  to admit pt to labor and delivery with PEC labs pending Pt stable at time of transfer to L&D    Assessment: 1. Normal labor   2. Hypertension affecting pregnancy in third trimester     Plan: Admit to L&D PEC labs pending   Sharen Counter Certified Nurse-Midwife 09/04/2019 5:07 PM

## 2019-09-05 ENCOUNTER — Encounter (HOSPITAL_COMMUNITY): Payer: Self-pay | Admitting: Obstetrics & Gynecology

## 2019-09-05 LAB — CBC
HCT: 27.3 % — ABNORMAL LOW (ref 36.0–46.0)
Hemoglobin: 8.6 g/dL — ABNORMAL LOW (ref 12.0–15.0)
MCH: 29.2 pg (ref 26.0–34.0)
MCHC: 31.5 g/dL (ref 30.0–36.0)
MCV: 92.5 fL (ref 80.0–100.0)
Platelets: 170 10*3/uL (ref 150–400)
RBC: 2.95 MIL/uL — ABNORMAL LOW (ref 3.87–5.11)
RDW: 15.2 % (ref 11.5–15.5)
WBC: 11.2 10*3/uL — ABNORMAL HIGH (ref 4.0–10.5)
nRBC: 0 % (ref 0.0–0.2)

## 2019-09-05 LAB — RPR: RPR Ser Ql: NONREACTIVE

## 2019-09-05 NOTE — Lactation Note (Signed)
This note was copied from a baby's chart. Lactation Consultation Note  Patient Name: Boy Jameika Allende TWYSO'R Date: 09/05/2019 Reason for consult: Follow-up assessment Infant fussing on arrival.Baby boy Duhart now 7 hours old.   After speaking with mom for a few minutes about breastfeeding, she easlily latched him and good bf observed.  Mom latched him on left breast in cradle hold. Mom reports she has three girls.  They all breastfed well with no challenges.  Mom reports they wanted to wean them all at 1 year and it was always hard getting them to wean.  Mom is an experienced bf mom.  Reports she feels "zaphir" is starting to eat more and do some cluster feeding. Mom denies breast or nipple soreness or tenderness. Urged to feed on cue and 8-12 times a day or more.  Call lactation as needed.  Maternal Data    Feeding Feeding Type: Breast Fed  LATCH Score Latch: Grasps breast easily, tongue down, lips flanged, rhythmical sucking.  Audible Swallowing: A few with stimulation  Type of Nipple: Everted at rest and after stimulation  Comfort (Breast/Nipple): Soft / non-tender  Hold (Positioning): No assistance needed to correctly position infant at breast.  LATCH Score: 9  Interventions Interventions: Breast feeding basics reviewed  Lactation Tools Discussed/Used     Consult Status Consult Status: PRN Date: 09/06/19 Follow-up type: In-patient    Willette Mudry Michaelle Copas 09/05/2019, 6:13 PM

## 2019-09-05 NOTE — Progress Notes (Signed)
Patient is doing well.  She is ambulating, voiding, tolerating PO.  Pain control is good.  Lochia is appropriate  Vitals:   09/04/19 2021 09/04/19 2135 09/05/19 0130 09/05/19 0547  BP: 123/78 130/82 119/77 113/78  Pulse: (!) 101 99 (!) 111 100  Resp: 18 20 18 18   Temp: 97.7 F (36.5 C) 97.7 F (36.5 C) 98.1 F (36.7 C) 97.6 F (36.4 C)  TempSrc: Oral Oral Oral Oral  SpO2: 100% 100% 100% 100%  Weight:      Height:        NAD Fundus firm Ext: trace edema bilaterally  Lab Results  Component Value Date   WBC 11.2 (H) 09/05/2019   HGB 8.6 (L) 09/05/2019   HCT 27.3 (L) 09/05/2019   MCV 92.5 09/05/2019   PLT 170 09/05/2019    --/--/A POS, A POS Performed at [facility] [address], [place], [place] [address]  (01/20 1615)/RNI  A/P 39 y.o. [id number] PPD#1. Routine care.   Newly elevated BPs just prior to delivery, including 2 severe range BP.  Magnesium sulfate was recommend for 24 hours post delivery, but patient declined.  BPs have trended down to normal range.   Will monitor today.  Patient asymptomatic.  Circ as outpatient RNI:  MMR prior to discharge Expect d/c tomorrow.    [place]

## 2019-09-06 NOTE — Lactation Note (Addendum)
This note was copied from a baby's chart. Lactation Consultation Note:  Mother reports that infant is feeding well. She reports that her milk is in and and denies having any nipple trauma.   Mother was advised to do good reverse pressure technique to soften areola tissue.  Mother was given a hand pump and a #27 flange. Observed good milk flow when used the hand pump. Discussed cluster feeding and frequent hand expression.  Mother was advised to continue to breastfeed infant 8-12 times or more and do frequent STS.  Mother advised to follow up with [facility] services and community support.   Patient Name: Cynthia LoganI Date: 09/06/2019 Reason for consult: Follow-up assessment;Early term 37-38.6wks   Maternal Data    Feeding Feeding Type: Breast Fed  LATCH Score                   Interventions Interventions: Hand pump  Lactation Tools Discussed/Used     Consult Status Consult Status: Complete    Michel Bickers 09/06/2019, 9:16 AM

## 2019-09-06 NOTE — Discharge Summary (Signed)
Obstetric Discharge Summary Reason for Admission: onset of labor Prenatal Procedures: Preeclampsia and ultrasound Intrapartum Procedures: spontaneous vaginal delivery Postpartum Procedures: none Complications-Operative and Postpartum: 1st degree perineal laceration Hemoglobin  Date Value Ref Range Status  09/05/2019 8.6 (L) 12.0 - 15.0 g/dL Final   HCT  Date Value Ref Range Status  09/05/2019 27.3 (L) 36.0 - 46.0 % Final    Physical Exam:  General: alert and cooperative Lochia: appropriate Uterine Fundus: firm DVT Evaluation: No evidence of DVT seen on physical exam.  Discharge Diagnoses: Term Pregnancy-delivered  Discharge Information: Date: 09/06/2019 Activity: pelvic rest Diet: routine Medications: PNV, Ibuprofen and Iron Condition: stable Instructions: refer to practice specific booklet Discharge to: home Follow-up Information    Taam-Akelman, Griselda Miner, MD Follow up in 4 week(s).   Specialty: Obstetrics and Gynecology Contact information: [address] [place] [address] [phone]           Newborn Data: Live born female  Birth Weight: 8 lb 8 oz (3856 g) APGAR: 8, 9  Newborn Delivery   Birth date/time: 09/04/2019 18:27:00 Delivery type: Vaginal, Spontaneous      Home with mother.  Philip Aspen 09/06/2019, 8:45 AM

## 2021-07-19 ENCOUNTER — Other Ambulatory Visit: Payer: Self-pay | Admitting: Internal Medicine

## 2021-07-19 DIAGNOSIS — N644 Mastodynia: Secondary | ICD-10-CM

## 2021-08-23 ENCOUNTER — Ambulatory Visit
Admission: RE | Admit: 2021-08-23 | Discharge: 2021-08-23 | Disposition: A | Discharge status: Home / Self Care | Payer: Medicaid Other | Source: Ambulatory Visit | Attending: Internal Medicine | Admitting: Internal Medicine

## 2021-08-23 DIAGNOSIS — N644 Mastodynia: Secondary | ICD-10-CM

## 2022-09-06 ENCOUNTER — Encounter: Payer: Self-pay | Admitting: Neurology

## 2022-09-06 ENCOUNTER — Ambulatory Visit (INDEPENDENT_AMBULATORY_CARE_PROVIDER_SITE_OTHER): Payer: Medicaid Other | Admitting: Neurology

## 2022-09-06 VITALS — BP 114/78 | HR 77 | Ht 60.0 in | Wt 193.8 lb

## 2022-09-06 DIAGNOSIS — R519 Headache, unspecified: Secondary | ICD-10-CM

## 2022-09-06 DIAGNOSIS — H539 Unspecified visual disturbance: Secondary | ICD-10-CM

## 2022-09-06 DIAGNOSIS — G43709 Chronic migraine without aura, not intractable, without status migrainosus: Secondary | ICD-10-CM | POA: Diagnosis not present

## 2022-09-06 NOTE — Progress Notes (Unsigned)
[facility]    Provider:  Dr Jaynee Eagles Requesting Provider: Trey Sailors, [place] Primary Care Provider:  London Pepper, MD  CC:  Chronic Migraines  HPI:  Cynthia Logan is a 42 y.o. female here as requested by Trey Sailors, [place] for migraines.   Started to noticed migraines during high school. Associated with menstrual cycles lasting 3-4 days. Occasional have slight headache improved with tylenol and rest. Now has 15 migraine days in a month. Has about 2-3 headaches a week, generally last about 24 hours. Sometimes wakes up with a headache with daytime fatigue. Does not snore or have apneic episodes at night. Not positional. Describes headache as constant on the left or frontal. Smells like perfumes trigger headaches. During the headache has black spots in vision. No other focal neurologic deficits, associated symptoms, inciting events or modifiable factors.  Sleeping helps with headache but needs to take care of her children.  Working out may help.  Not many triggers she has noticed.   Not hoping to become pregnant. OCP made headaches worse. Birth control: husband has a vasectomy.   From a thorough review of reords, Medications for migraines tried includes: Topiramate 25 mg took for 3 months stopped working  did not improve with increasing dose and headaches were getting worse, had brain frog Nortriptyline 10mg  for about 4 months also stopped working  Sumatriptan helped somewhat but made her very sleepy Labetalol  Ondansetron  Reviewed notes, labs and imaging from outside physicians, which showed;  CT head 06/03/1999 wnl  Review of Systems: Patient complains of symptoms per HPI as well as the following symptoms migraines. Pertinent negatives and positives per HPI. All others negative.   Social History   Socioeconomic History   Marital status: Married    Spouse name: Not on file   Number of children: Not on file   Years of education: Not on file    Highest education level: Not on file  Occupational History   Not on file  Tobacco Use   Smoking status: Never   Smokeless tobacco: Never  Vaping Use   Vaping Use: Never used  Substance and Sexual Activity   Alcohol use: Yes    Comment: social   Drug use: No   Sexual activity: Yes  Other Topics Concern   Not on file  Social History Narrative   Not on file   Social Determinants of Health   Financial Resource Strain: Not on file  Food Insecurity: Not on file  Transportation Needs: Not on file  Physical Activity: Not on file  Stress: Not on file  Social Connections: Not on file  Intimate Partner Violence: Not on file    Family History  Problem Relation Age of Onset   Alcohol abuse Neg Hx    Migraines Neg Hx     Past Medical History:  Diagnosis Date   Medical history non-contributory    Vaginal Pap smear, abnormal     Patient Active Problem List   Diagnosis Date Noted   Chronic migraine without aura without status migrainosus, not intractable 09/06/2022   Labor and delivery indication for care or intervention 09/04/2019   Pregnancy 09/26/2016    Past Surgical History:  Procedure Laterality Date   COLPOSCOPY     NO PAST SURGERIES      Current Outpatient Medications  Medication Sig Dispense Refill   Fremanezumab-vfrm (AJOVY) 225 MG/1.5ML SOAJ Inject 225 mg into the skin every 30 (thirty) days. 1.5 mL 11   rizatriptan (MAXALT-MLT)  10 MG disintegrating tablet Take 1 tablet (10 mg total) by mouth as needed for migraine. May repeat in 2 hours if needed 9 tablet 11   No current facility-administered medications for this visit.    Allergies as of 09/06/2022   (No Known Allergies)    Vitals: BP 114/78   Pulse 77   Ht 5' (1.524 m)   Wt 193 lb 12.8 oz (87.9 kg)   BMI 37.85 kg/m  Last Weight:  Wt Readings from Last 1 Encounters:  09/06/22 193 lb 12.8 oz (87.9 kg)   Last Height:   Ht Readings from Last 1 Encounters:  09/06/22 5' (1.524 m)     Physical  exam: Exam: Gen: NAD, conversant, well nourised, obese, well groomed                     CV: RRR, no MRG. No Carotid Bruits. No peripheral edema, warm, nontender Eyes: Conjunctivae clear without exudates or hemorrhage  Neuro: Detailed Neurologic Exam  Speech:    Speech is normal; fluent and spontaneous with normal comprehension.  Cognition:    The patient is oriented to person, place, and time;     recent and remote memory intact;     language fluent;     normal attention, concentration,     fund of knowledge Cranial Nerves:    The pupils are equal, round, and reactive to light. The fundi are flat. Visual fields are full to finger confrontation. Extraocular movements are intact. Trigeminal sensation is intact and the muscles of mastication are normal. The face is symmetric. The palate elevates in the midline. Hearing intact. Voice is normal. Shoulder shrug is normal. The tongue has normal motion without fasciculations.   Coordination: nml  Gait: nml  Motor Observation:    No asymmetry, no atrophy, and no involuntary movements noted. Tone:    Normal muscle tone.    Posture:    Posture is normal. normal erect    Strength:    Strength is V/V in the upper and lower limbs.      Sensation: intact to LT     Reflex Exam:  DTR's:    Deep tendon reflexes in the upper and lower extremities are normal bilaterally.   Toes:    The toes are downgoing bilaterally.   Clonus:    Clonus is absent.    Assessment/Plan:  Patient with chronic intractable migraines  Prevention: Start Ajovy once a month injection (may have to switch to emgality) Emergent: Rizatriptan: Please take one tablet at the onset of your headache. If it does not improve the symptoms please take one additional tablet. Do not take more then 2 tablets in 24hrs. Do not take use more then 2 to 3 times in a week.  Orders Placed This Encounter  Procedures   MR BRAIN W WO CONTRAST   CBC with Differential/Platelets    Comprehensive metabolic panel   TSH Rfx on Abnormal to Free T4   Pregnancy, urine   POCT urine pregnancy   Meds ordered this encounter  Medications   Fremanezumab-vfrm (AJOVY) 225 MG/1.5ML SOAJ    Sig: Inject 225 mg into the skin every 30 (thirty) days.    Dispense:  1.5 mL    Refill:  11   rizatriptan (MAXALT-MLT) 10 MG disintegrating tablet    Sig: Take 1 tablet (10 mg total) by mouth as needed for migraine. May repeat in 2 hours if needed    Dispense:  9 tablet  Refill:  11   MRI with and without contrast: MRI brain due to concerning symptoms of morning and nocturnal headaches, vision changes, worsening headaches  to look for space occupying mass, chiari or intracranial hypertension (pseudotumor), strokes, malignancies, vasculidities, demyelination(multiple sclerosis) or other  Ajovy for prevention Rizatriptan for acute migraines, can try ubrelvy and nurtec if not effective   UDS today  Possible sleep study if refractory to medications   Cc: Norm Salt, PA,  Farris Has, MD  Naomie Dean, MD  [facility] [address] [place], [place] [address]  Phone [phone] Fax [phone]

## 2022-09-06 NOTE — Patient Instructions (Signed)
Prevention: Start Ajovy once a month injection (may have to switch to emgality) Emergent: Rizatriptan: Please take one tablet at the onset of your headache. If it does not improve the symptoms please take one additional tablet. Do not take more then 2 tablets in 24hrs. Do not take use more then 2 to 3 times in a week.  Rizatriptan Disintegrating Tablets What is this medication? RIZATRIPTAN (rye za TRIP tan) treats migraines. It works by blocking pain signals and narrowing blood vessels in the brain. It belongs to a group of medications called triptans. It is not used to prevent migraines. This medicine may be used for other purposes; ask your health care provider or pharmacist if you have questions. COMMON BRAND NAME(S): Maxalt-MLT What should I tell my care team before I take this medication? They need to know if you have any of these conditions: Circulation problems in fingers and toes Diabetes Heart disease High blood pressure High cholesterol History of irregular heartbeat History of stroke Stomach or intestine problems Tobacco use An unusual or allergic reaction to rizatriptan, other medications, foods, dyes, or preservatives Pregnant or trying to get pregnant Breast-feeding How should I use this medication? Take this medication by mouth. Take it as directed on the prescription label. You do not need water to take this medication. Leave the tablet in the sealed pack until you are ready to take it. With dry hands, open the pack and gently remove the tablet. If the tablet breaks or crumbles, throw it away. Use a new tablet. Place the tablet on the tongue and allow it to dissolve. Then, swallow it. Do not cut, crush, or chew this medication. Do not use it more often than directed. Talk to your care team about the use of this medication in children. While it may be prescribed for children as young as 6 years for selected conditions, precautions do apply. Overdosage: If you think you have  taken too much of this medicine contact a poison control center or emergency room at once. NOTE: This medicine is only for you. Do not share this medicine with others. What if I miss a dose? This does not apply. This medication is not for regular use. What may interact with this medication? Do not take this medication with any of the following: Ergot alkaloids, such as dihydroergotamine, ergotamine MAOIs, such as Marplan, Nardil, Parnate Other medications for migraine headache, such as almotriptan, eletriptan, frovatriptan, naratriptan, sumatriptan, zolmitriptan This medication may also interact with the following: Certain medications for depression, anxiety, or other mental health conditions Propranolol This list may not describe all possible interactions. Give your health care provider a list of all the medicines, herbs, non-prescription drugs, or dietary supplements you use. Also tell them if you smoke, drink alcohol, or use illegal drugs. Some items may interact with your medicine. What should I watch for while using this medication? Visit your care team for regular checks on your progress. Tell your care team if your symptoms do not start to get better or if they get worse. This medication may affect your coordination, reaction time, or judgment. Do not drive or operate machinery until you know how this medication affects you. Sit up or stand slowly to reduce the risk of dizzy or fainting spells. If you take migraine medications for 10 or more days a month, your migraines may get worse. Keep a diary of headache days and medication use. Contact your care team if your migraine attacks occur more frequently. What side effects may I  notice from receiving this medication? Side effects that you should report to your care team as soon as possible: Allergic reactions--skin rash, itching, hives, swelling of the face, lips, tongue, or throat Burning, pain, tingling, or color changes in the hands,  arms, legs, or feet Heart attack--pain or tightness in the chest, shoulders, arms, or jaw, nausea, shortness of breath, cold or clammy skin, feeling faint or lightheaded Heart rhythm changes--fast or irregular heartbeat, dizziness, feeling faint or lightheaded, chest pain, trouble breathing Increase in blood pressure Irritability, confusion, fast or irregular heartbeat, muscle stiffness, twitching muscles, sweating, high fever, seizure, chills, vomiting, diarrhea, which may be signs of serotonin syndrome Raynaud syndrome--cool, numb, or painful fingers or toes that may change color from pale, to blue, to red Seizures Stroke--sudden numbness or weakness of the face, arm, or leg, trouble speaking, confusion, trouble walking, loss of balance or coordination, dizziness, severe headache, change in vision Sudden or severe stomach pain, bloody diarrhea, fever, nausea, vomiting Vision loss Side effects that usually do not require medical attention (report to your care team if they continue or are bothersome): Dizziness Unusual weakness or fatigue This list may not describe all possible side effects. Call your doctor for medical advice about side effects. You may report side effects to FDA at 1-800-FDA-1088. Where should I keep my medication? Keep out of the reach of children and pets. Store at room temperature between 15 and 30 degrees C (59 and 86 degrees F). Protect from light and moisture. Get rid of any unused medication after the expiration date. To get rid of medications that are no longer needed or have expired: Take the medication to a medication take-back program. Check with your pharmacy or law enforcement to find a location. If you cannot return the medication, check the label or package insert to see if the medication should be thrown out in the garbage or flushed down the toilet. If you are not sure, ask your care team. If it is safe to put it in the trash, empty the medication out of the  container. Mix the medication with cat litter, dirt, coffee grounds, or other unwanted substance. Seal the mixture in a bag or container. Put it in the trash. NOTE: This sheet is a summary. It may not cover all possible information. If you have questions about this medicine, talk to your doctor, pharmacist, or health care provider.  2023 Elsevier/Gold Standard (2021-12-02 00:00:00) Rolanda Lundborg Injection What is this medication? FREMANEZUMAB (fre ma NEZ ue mab) prevents migraines. It works by blocking a substance in the body that causes migraines. It is a monoclonal antibody. This medicine may be used for other purposes; ask your health care provider or pharmacist if you have questions. COMMON BRAND NAME(S): AJOVY What should I tell my care team before I take this medication? They need to know if you have any of these conditions: An unusual or allergic reaction to fremanezumab, other medications, foods, dyes, or preservatives Pregnant or trying to get pregnant Breast-feeding How should I use this medication? This medication is injected under the skin. You will be taught how to prepare and give it. Take it as directed on the prescription label. Keep taking it unless your care team tells you to stop. It is important that you put your used needles and syringes in a special sharps container. Do not put them in a trash can. If you do not have a sharps container, call your pharmacist or care team to get one. Talk to your care team  about the use of this medication in children. Special care may be needed. Overdosage: If you think you have taken too much of this medicine contact a poison control center or emergency room at once. NOTE: This medicine is only for you. Do not share this medicine with others. What if I miss a dose? If you miss a dose, take it as soon as you can. If it is almost time for your next dose, take only that dose. Do not take double or extra doses. What may interact with this  medication? Interactions are not expected. This list may not describe all possible interactions. Give your health care provider a list of all the medicines, herbs, non-prescription drugs, or dietary supplements you use. Also tell them if you smoke, drink alcohol, or use illegal drugs. Some items may interact with your medicine. What should I watch for while using this medication? Tell your care team if your symptoms do not start to get better or if they get worse. What side effects may I notice from receiving this medication? Side effects that you should report to your care team as soon as possible: Allergic reactions or angioedema--skin rash, itching or hives, swelling of the face, eyes, lips, tongue, arms, or legs, trouble swallowing or breathing Side effects that usually do not require medical attention (report to your care team if they continue or are bothersome): Pain, redness, or irritation at injection site This list may not describe all possible side effects. Call your doctor for medical advice about side effects. You may report side effects to FDA at 1-800-FDA-1088. Where should I keep my medication? Keep out of the reach of children and pets. Store in a refrigerator or at room temperature between 20 and 25 degrees C (68 and 77 degrees F). Refrigeration (preferred): Store in the refrigerator. Do not freeze. Keep in the original container until you are ready to take it. Remove the dose from the carton about 30 minutes before it is time for you to use it. If the dose is not used, it may be stored in the original container at room temperature for 7 days. Get rid of any unused medication after the expiration date. Room Temperature: This medication may be stored at room temperature for up to 7 days. Keep it in the original container. Protect from light until time of use. If it is stored at room temperature, get rid of any unused medication after 7 days or after it expires, whichever is first. To  get rid of medications that are no longer needed or have expired: Take the medication to a medication take-back program. Check with your pharmacy or law enforcement to find a location. If you cannot return the medication, ask your pharmacist or care team how to get rid of this medication safely. NOTE: This sheet is a summary. It may not cover all possible information. If you have questions about this medicine, talk to your doctor, pharmacist, or health care provider.  2023 Elsevier/Gold Standard (2017-05-02 00:00:00)

## 2022-09-07 LAB — PREGNANCY, URINE: Preg Test, Ur: NEGATIVE

## 2022-09-08 ENCOUNTER — Encounter: Payer: Self-pay | Admitting: Neurology

## 2022-09-08 MED ORDER — RIZATRIPTAN BENZOATE 10 MG PO TBDP: 10.0000 mg | ORAL_TABLET | ORAL | 11 refills | Status: DC | PRN

## 2022-09-08 MED ORDER — AJOVY 225 MG/1.5ML ~~LOC~~ SOAJ: 225.0000 mg | SUBCUTANEOUS | 11 refills | Status: DC

## 2022-09-16 ENCOUNTER — Telehealth: Payer: Self-pay | Admitting: Neurology

## 2022-09-16 NOTE — Telephone Encounter (Signed)
[employer] Josem Kaufmann: [id number] exp.09/14/2022-10/14/2022 sent to GI [phone]

## 2022-10-03 ENCOUNTER — Telehealth: Payer: Self-pay | Admitting: *Deleted

## 2022-10-03 NOTE — Telephone Encounter (Signed)
KEY: [id number]

## 2022-10-05 ENCOUNTER — Encounter: Payer: Self-pay | Admitting: Neurology

## 2022-10-05 NOTE — Telephone Encounter (Signed)
Patient Advocate Encounter   Received notification that prior authorization for AJOVY (fremanezumab-vfrm) injection 225MG/1.5ML auto-injectors is required.   PA submitted on 10/05/2022 Key [id number] Status is pending       Lyndel Safe, [place] Patient Advocate Specialist [place] Patient Advocate Team Direct Number: [phone]  Fax: [phone]

## 2022-10-06 NOTE — Telephone Encounter (Signed)
Received a fax from [employer] Ajovy has been approved for 4.5 mL/90 days through 01/03/23.   Approval letter faxed to pharmacy. Received a receipt of confirmation.

## 2022-10-11 ENCOUNTER — Ambulatory Visit
Admission: RE | Admit: 2022-10-11 | Discharge: 2022-10-11 | Disposition: A | Discharge status: Home / Self Care | Payer: Medicaid Other | Source: Ambulatory Visit | Attending: Neurology | Admitting: Neurology

## 2022-10-11 DIAGNOSIS — R519 Headache, unspecified: Secondary | ICD-10-CM | POA: Diagnosis not present

## 2022-10-11 DIAGNOSIS — H539 Unspecified visual disturbance: Secondary | ICD-10-CM

## 2022-10-11 MED ORDER — GADOPICLENOL 0.5 MMOL/ML IV SOLN
9.0000 mL | Freq: Once | INTRAVENOUS | Status: AC | PRN
  Administered 2022-10-11: 9 mL via INTRAVENOUS

## 2022-12-06 ENCOUNTER — Encounter: Payer: Self-pay | Admitting: Neurology

## 2023-01-03 ENCOUNTER — Encounter: Payer: Self-pay | Admitting: Neurology

## 2023-01-03 ENCOUNTER — Ambulatory Visit: Payer: Medicaid Other | Admitting: Neurology

## 2023-02-28 ENCOUNTER — Other Ambulatory Visit: Payer: Self-pay | Admitting: *Deleted

## 2023-02-28 ENCOUNTER — Other Ambulatory Visit (HOSPITAL_COMMUNITY): Payer: Self-pay

## 2023-02-28 ENCOUNTER — Telehealth: Payer: Self-pay

## 2023-02-28 ENCOUNTER — Encounter: Payer: Self-pay | Admitting: *Deleted

## 2023-02-28 NOTE — Telephone Encounter (Signed)
I sent the patient a mychart message. Will update when I hear back from her.

## 2023-02-28 NOTE — Telephone Encounter (Signed)
Received a PA renewal for Ajovy-PT has not been seen since starting as well as PT was having some side effects with the injection site and a possibility of changing the meds-Please advise.

## 2023-05-01 ENCOUNTER — Telehealth (INDEPENDENT_AMBULATORY_CARE_PROVIDER_SITE_OTHER): Payer: Medicaid Other | Admitting: Neurology

## 2023-05-01 ENCOUNTER — Encounter: Payer: Self-pay | Admitting: Neurology

## 2023-05-01 DIAGNOSIS — G43709 Chronic migraine without aura, not intractable, without status migrainosus: Secondary | ICD-10-CM

## 2023-05-01 MED ORDER — QULIPTA 60 MG PO TABS: 60.0000 mg | ORAL_TABLET | Freq: Every day | ORAL | Status: DC

## 2023-05-01 MED ORDER — QULIPTA 60 MG PO TABS: 60.0000 mg | ORAL_TABLET | Freq: Every day | ORAL | 11 refills | Status: DC

## 2023-05-01 MED ORDER — QULIPTA 60 MG PO TABS: 60.0000 mg | ORAL_TABLET | Freq: Every day | ORAL | 0 refills | Status: DC

## 2023-05-01 NOTE — Patient Instructions (Signed)
Qulipta Atogepant Tablets What is this medication? ATOGEPANT (a TOE je pant) prevents migraines. It works by blocking a substance in the body that causes migraines. This medicine may be used for other purposes; ask your health care provider or pharmacist if you have questions. COMMON BRAND NAME(S): QULIPTA What should I tell my care team before I take this medication? They need to know if you have any of these conditions: Kidney disease Liver disease An unusual or allergic reaction to atogepant, other medications, foods, dyes, or preservatives Pregnant or trying to get pregnant Breast-feeding How should I use this medication? Take this medication by mouth with water. Take it as directed on the prescription label at the same time every day. You can take it with or without food. If it upsets your stomach, take it with food. Keep taking it unless your care team tells you to stop. Talk to your care team about the use of this medication in children. Special care may be needed. Overdosage: If you think you have taken too much of this medicine contact a poison control center or emergency room at once. NOTE: This medicine is only for you. Do not share this medicine with others. What if I miss a dose? If you miss a dose, take it as soon as you can. If it is almost time for your next dose, take only that dose. Do not take double or extra doses. What may interact with this medication? Carbamazepine Certain medications for fungal infections, such as itraconazole, ketoconazole Clarithromycin Cyclosporine Efavirenz Etravirine Phenytoin Rifampin St. John's wort This list may not describe all possible interactions. Give your health care provider a list of all the medicines, herbs, non-prescription drugs, or dietary supplements you use. Also tell them if you smoke, drink alcohol, or use illegal drugs. Some items may interact with your medicine. What should I watch for while using this  medication? Visit your care team for regular checks on your progress. Tell your care team if your symptoms do not start to get better or if they get worse. What side effects may I notice from receiving this medication? Side effects that you should report to your care team as soon as possible: Allergic reactions--skin rash, itching, hives, swelling of the face, lips, tongue, or throat Side effects that usually do not require medical attention (report to your care team if they continue or are bothersome): Constipation Fatigue Loss of appetite with weight loss Nausea This list may not describe all possible side effects. Call your doctor for medical advice about side effects. You may report side effects to FDA at 1-800-FDA-1088. Where should I keep my medication? Keep out of the reach of children and pets. Store at room temperature between 20 and 25 degrees C (68 and 77 degrees F). Get rid of any unused medication after the expiration date. To get rid of medications that are no longer needed or have expired: Take the medication to a medication take-back program. Check with your pharmacy or law enforcement to find a location. If you cannot return the medication, check the label or package insert to see if the medication should be thrown out in the garbage or flushed down the toilet. If you are not sure, ask your care team. If it is safe to put it in the trash, take the medication out of the container. Mix the medication with cat litter, dirt, coffee grounds, or other unwanted substance. Seal the mixture in a bag or container. Put it in the trash. NOTE: This  sheet is a summary. It may not cover all possible information. If you have questions about this medicine, talk to your doctor, pharmacist, or health care provider.  2024 Elsevier/Gold Standard (2021-09-20 00:00:00)

## 2023-05-01 NOTE — Addendum Note (Signed)
Addended by: Naomie Dean B on: 05/01/2023 06:00 PM   Modules accepted: Orders

## 2023-05-01 NOTE — Progress Notes (Addendum)
[facility]    Provider:  Dr Lucia Gaskins Requesting Provider: Farris Has, MD Primary Care Provider:  Farris Has, MD  CC:  Chronic Migraines  Virtual Visit via Video Note  I connected with Cynthia Logan on 05/01/23 at 11:00 AM EDT by a video enabled telemedicine application and verified that I am speaking with the correct person using two identifiers.  Location: Patient: home Provider: office   I discussed the limitations of evaluation and management by telemedicine and the availability of in person appointments. The patient expressed understanding and agreed to proceed.  Follow Up Instructions:    I discussed the assessment and treatment plan with the patient. The patient was provided an opportunity to ask questions and all were answered. The patient agreed with the plan and demonstrated an understanding of the instructions.   The patient was advised to call back or seek an in-person evaluation if the symptoms worsen or if the condition fails to improve as anticipated.  I provided 25 minutes of non-face-to-face time during this encounter.   Anson Fret, MD  05/01/2023: 2nd month had a big red itchy painful spot, tried different areas for months more, tried for 8 months, towards the 4-5th month migraines started to progress and migraine prevention failed for > 3 months back to baseline Now has 15 migraine days in a month. Has about 2-3 headaches a week, generally last about 24 hours. So almost all month had a headache or migraines. Given the reaction to the injection the reaction worsened over the month and the circle got bigger any place she put it even with pre-treatment, painful to the touch. Options: daily pill 4% of people get some GI discomfort and no injection. Also Vyepti which an infusion every 3 months. Botox for migraines. Leave samples of Qulipta at the desk. Prescribe it as well. After this would consider Botox. Have office call for follow up.    Patient complains of symptoms per HPI as well as the following symptoms: none . Pertinent negatives and positives per HPI. All others negative   HPI 09/06/2022:  Cynthia Logan is a 42 y.o. female here as requested by Farris Has, MD for migraines.   Started to noticed migraines during high school. Associated with menstrual cycles lasting 3-4 days. Occasional have slight headache improved with tylenol and rest. Now has 15 migraine days in a month. Has about 2-3 headaches a week, generally last about 24 hours. Sometimes wakes up with a headache with daytime fatigue. Does not snore or have apneic episodes at night. Not positional. Describes headache as constant on the left or frontal. Smells like perfumes trigger headaches. During the headache has black spots in vision. No other focal neurologic deficits, associated symptoms, inciting events or modifiable factors.  Sleeping helps with headache but needs to take care of her children.  Working out may help.  Not many triggers she has noticed.   Not hoping to become pregnant. OCP made headaches worse. Birth control: husband has a vasectomy.   From a thorough review of reords, Medications for migraines tried includes: Topiramate 25 mg took for 3 months stopped working  did not improve with increasing dose and headaches were getting worse, had brain frog Nortriptyline 10mg  for about 4 months also stopped working  Sumatriptan helped somewhat but made her very sleepy Labetalol  Ondansetron  Reviewed notes, labs and imaging from outside physicians, which showed;  CT head 06/03/1999 wnl  Review of Systems: Patient complains of symptoms per HPI  as well as the following symptoms migraines. Pertinent negatives and positives per HPI. All others negative.   Social History   Socioeconomic History   Marital status: Married    Spouse name: Not on file   Number of children: Not on file   Years of education: Not on file   Highest education level:  Not on file  Occupational History   Not on file  Tobacco Use   Smoking status: Never   Smokeless tobacco: Never  Vaping Use   Vaping status: Never Used  Substance and Sexual Activity   Alcohol use: Yes    Comment: social   Drug use: No   Sexual activity: Yes  Other Topics Concern   Not on file  Social History Narrative   Not on file   Social Determinants of Health   Financial Resource Strain: Not on file  Food Insecurity: Not on file  Transportation Needs: Not on file  Physical Activity: Not on file  Stress: Not on file  Social Connections: Not on file  Intimate Partner Violence: Not on file    Family History  Problem Relation Age of Onset   Alcohol abuse Neg Hx    Migraines Neg Hx     Past Medical History:  Diagnosis Date   Medical history non-contributory    Vaginal Pap smear, abnormal     Patient Active Problem List   Diagnosis Date Noted   Chronic migraine without aura without status migrainosus, not intractable 09/06/2022   Labor and delivery indication for care or intervention 09/04/2019   Pregnancy 09/26/2016    Past Surgical History:  Procedure Laterality Date   COLPOSCOPY     NO PAST SURGERIES      Current Outpatient Medications  Medication Sig Dispense Refill   Atogepant (QULIPTA) 60 MG TABS Take 1 tablet (60 mg total) by mouth daily. 30 tablet 11   Atogepant (QULIPTA) 60 MG TABS Take 1 tablet (60 mg total) by mouth daily.     rizatriptan (MAXALT-MLT) 10 MG disintegrating tablet Take 1 tablet (10 mg total) by mouth as needed for migraine. May repeat in 2 hours if needed 9 tablet 11   No current facility-administered medications for this visit.    Allergies as of 05/01/2023 - Review Complete 05/01/2023  Allergen Reaction Noted   Ajovy [fremanezumab-vfrm] Rash 02/28/2023    Vitals: There were no vitals taken for this visit. Last Weight:  Wt Readings from Last 1 Encounters:  09/06/22 193 lb 12.8 oz (87.9 kg)   Last Height:   Ht  Readings from Last 1 Encounters:  09/06/22 5' (1.524 m)    Physical exam: Exam: Gen: NAD, conversant      CV:  Denies palpitations or chest pain or SOB. VS: Breathing at a normal rate. . Not febrile. Eyes: Conjunctivae clear without exudates or hemorrhage  Neuro: Detailed Neurologic Exam  Speech:    Speech is normal; fluent and spontaneous with normal comprehension.  Cognition:    The patient is oriented to person, place, and time;     recent and remote memory intact;     language fluent;     normal attention, concentration,     fund of knowledge Cranial Nerves:    The pupils are equal, round, and reactive to light.  Visual fields are full to finger confrontation. Extraocular movements are intact.  The face is symmetric with normal sensation. The palate elevates in the midline. Hearing intact. Voice is normal. Shoulder shrug is normal. The tongue has  normal motion without fasciculations.   Coordination:    Normal finger to nose  Gait:    Normal native gait  Motor Observation:   no involuntary movements noted. Tone:    Appears normal  Posture:    Posture is normal. normal erect    Strength:    Strength is anti-gravity and symmetric in the upper and lower limbs.      Sensation: intact to LT           Assessment/Plan:  Patient with chronic intractable migraines  Ajovy with progressively worse rash every month, I would say that other cgrp injections are contraindicated at this time. Try Bennie Pierini which is approved for chronic migraines now.  Emergent: Rizatriptan: Please take one tablet at the onset of your headache. If it does not improve the symptoms please take one additional tablet. Do not take more then 2 tablets in 24hrs. Do not take use more then 2 to 3 times in a week.  05/01/2023: 2nd month had a big red itchy painful spot, tried different areas for months more, tried for 8 months, towards the 4-5th month migraines started to progress and migraine prevention  failed for > 3 months back to baseline Now has 15 migraine days in a month. Has about 2-3 headaches a week, generally last about 24 hours. Discussed Options: daily pill 4% of people get some GI discomfort and no injection. Also Vyepti which an infusion every 3 months. Botox for migraines. Leave samples of Qulipta at the desk. Prescribe it as well. After this would consider Botox. Have office call for follow up.    Meds ordered this encounter  Medications   Atogepant (QULIPTA) 60 MG TABS    Sig: Take 1 tablet (60 mg total) by mouth daily.    Dispense:  30 tablet    Refill:  11   Atogepant (QULIPTA) 60 MG TABS    Sig: Take 1 tablet (60 mg total) by mouth daily.    Possible sleep study if refractory to medications   Cc: Farris Has, MD,  Farris Has, MD  Naomie Dean, MD  [facility] [address] [place], [place] [address]  Phone [phone] Fax [phone]

## 2023-05-02 ENCOUNTER — Telehealth: Payer: Self-pay | Admitting: *Deleted

## 2023-05-02 NOTE — Telephone Encounter (Signed)
Qulipta 60 mg samples x 7 boxes ready for pt pickup. LOT: [id number]  EXP: 10/2024  NDC: 4540-9811-91. Per Jael, pt called and said husband Mohammed Oleary (on [place]) will be picking up samples.

## 2023-05-19 ENCOUNTER — Telehealth: Payer: Self-pay

## 2023-05-19 NOTE — Telephone Encounter (Signed)
*  [facility]  Pharmacy Patient Advocate Encounter   Received notification from CoverMyMeds that prior authorization for Qulipta 60MG  tablets  is required/requested.   Insurance verification completed.   The patient is insured through [facility] .   Per test claim: PA required; PA started via CoverMyMeds. KEY [id number] . Waiting for clinical questions to populate.

## 2023-05-26 NOTE — Telephone Encounter (Signed)
Pharmacy Patient Advocate Encounter  Received notification from [facility] that Prior Authorization for Qulipta 60MG  tablets has been DENIED.  Full denial letter will be uploaded to the media tab. See denial reason below.  Here are the policy requirements your request did not meet:  Your doctor must tell us the following:  You do not have headaches from medicine over-use [facility]), also known as a rebound headache. You have had 4 or more migraine days a month for at least 3 months You are using other ways to help prevent migraines (such as therapy and lifestyle changes) You must meet the trial and failure rule found on the [facility] Preferred Drug List (PDL).  Our records show you have not tried any preferred drugs You must try and fail or have your doctor provide a medical reason why you cannot take the preferred: Aimovig and Emgality  PA #/Case ID/Reference #: [id number]  Please be advised we currently do not have a Pharmacist to review denials, therefore you will need to process appeals accordingly as needed. Thanks for your support at this time. Contact for appeals are as follows: Phone: [phone], Fax: [phone]  Last day to appeal is 07-18-2023

## 2023-05-30 NOTE — Telephone Encounter (Signed)
We will offer patient an appeal since she had a rash with CGRP injection Ajovy. Bennie Pierini was ordered instead of more CGRP injections.

## 2023-05-30 NOTE — Telephone Encounter (Signed)
I LVM for pt asking for call back.

## 2023-06-05 ENCOUNTER — Encounter: Payer: Self-pay | Admitting: *Deleted

## 2023-06-05 NOTE — Telephone Encounter (Signed)
Appeal letter is pending Dr Trevor Mace signature.

## 2023-06-07 NOTE — Telephone Encounter (Signed)
Appeal letter has been signed by Dr. Lucia Gaskins but we are waiting on the patient to sign the appeal request form that we sent to her.

## 2023-06-28 NOTE — Telephone Encounter (Signed)
Form not received yet. I called the pt and reminded her. She will work on this and send back to [place] asap.

## 2023-12-18 ENCOUNTER — Telehealth: Payer: Self-pay | Admitting: Neurology

## 2023-12-18 NOTE — Telephone Encounter (Signed)
 Pt said, insurance has be straighten out and need to get back on medication for headaches. Currently have a bad headache since Friday with pain level at a 7. Have been taking over-the-counter, Excedrin Migraine and is not helping. Have scheduled  appointment on 02/12/24 at 2:00 pm. Patient ask to be put on the waitlist. Would like a call from the nurse to discuss if can be sooner than 02/12/24.

## 2023-12-18 NOTE — Telephone Encounter (Signed)
 I spoke with the patient. Was last seen 05/01/23. She said she got her insurance straightened out. Last fall she tried Qulipta  samples for 1 month and then could not get it d/t insurance issue but the samples did seem to help. She feels she just needs to get back on that medication. She denies any new symptoms. Technically she can see an NP but we also discussed that the pharmacy should have refills on file and she could try to get the Qulipta  filled in the meantime until her appt with Dr Tresia Fruit. If unable to fill and we need even more recent OV notes then she can get in with NP. Currently we have several openings this week. She thanked me for the call.

## 2023-12-20 ENCOUNTER — Telehealth: Payer: Self-pay

## 2023-12-20 ENCOUNTER — Other Ambulatory Visit (HOSPITAL_COMMUNITY): Payer: Self-pay

## 2023-12-20 NOTE — Telephone Encounter (Signed)
 Pharmacy Patient Advocate Encounter   Received notification from Fax that prior authorization for Qulipta  60MG  tablets is required/requested.   Insurance verification completed.   The patient is insured through  [place]  .   Per test claim: PA required; PA submitted to above mentioned insurance via CoverMyMeds Key/confirmation #/EOC [id number] Status is pending  PA form saved as well in case there are any issues with CMM.

## 2023-12-27 ENCOUNTER — Other Ambulatory Visit (HOSPITAL_COMMUNITY): Payer: Self-pay

## 2023-12-27 NOTE — Telephone Encounter (Signed)
   I will call insurance [employer]IVIO for clarification.

## 2024-01-08 IMAGING — MG DIGITAL DIAGNOSTIC BILAT W/ TOMO W/ CAD
8 series · 8 of 24 positions shown · non-contrast
Comparison: None.

CLINICAL DATA: Patient describes pain within the lower RIGHT breast
radiating to the RIGHT axilla. Patient recently stopped
breast-feeding. This is patient's first mammogram.

EXAM:
DIGITAL DIAGNOSTIC BILATERAL MAMMOGRAM WITH TOMOSYNTHESIS AND CAD;
ULTRASOUND RIGHT BREAST LIMITED
TECHNIQUE: Bilateral digital diagnostic mammography and breast tomosynthesis
was performed. The images were evaluated with computer-aided
detection.; Targeted ultrasound examination of the right breast was
performed

[L CC synth-2D]
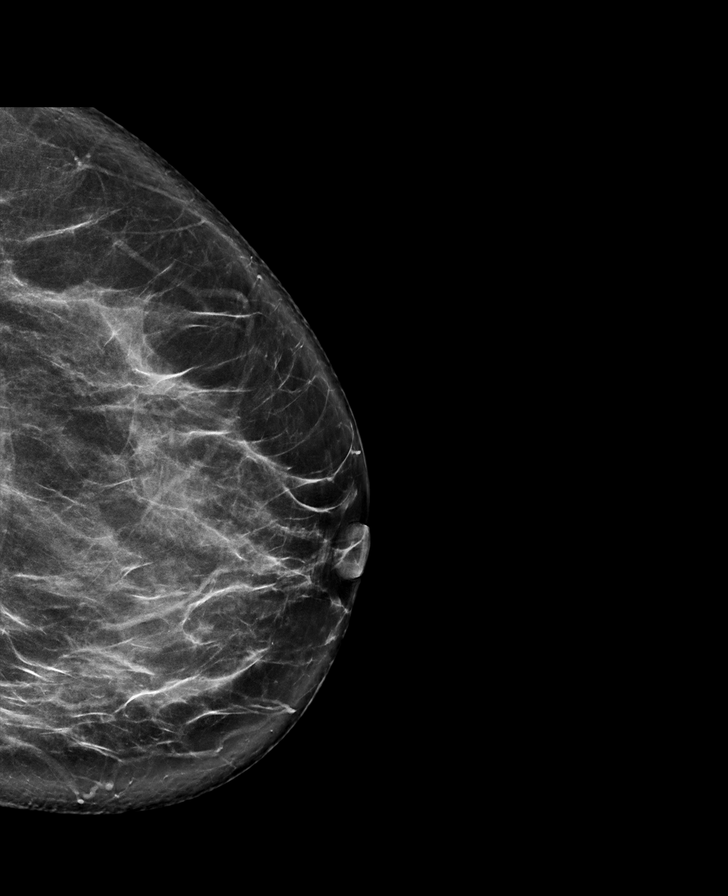

[L MLO synth-2D]
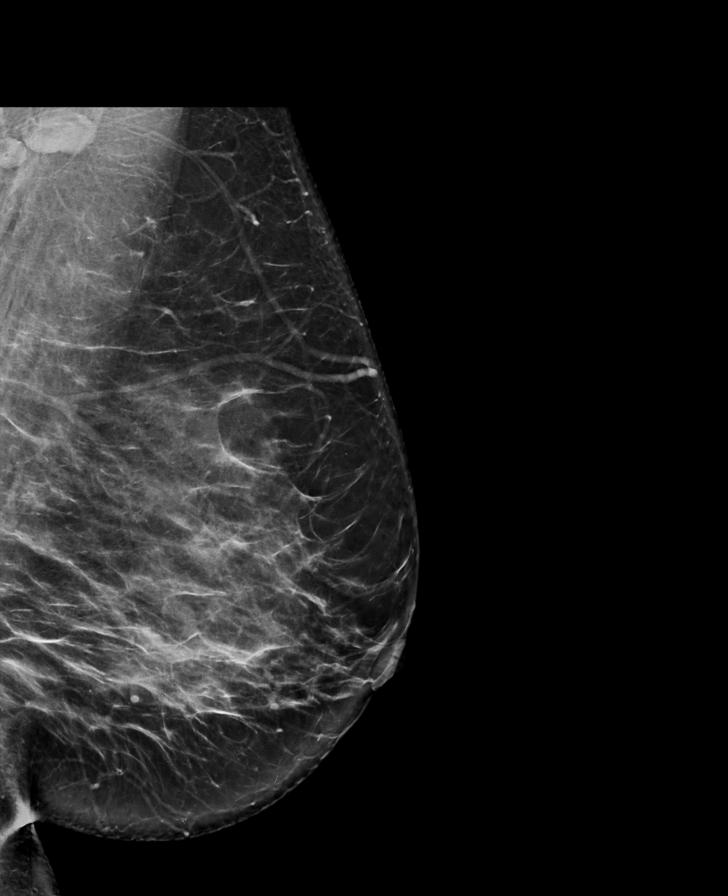

[R CC synth-2D]
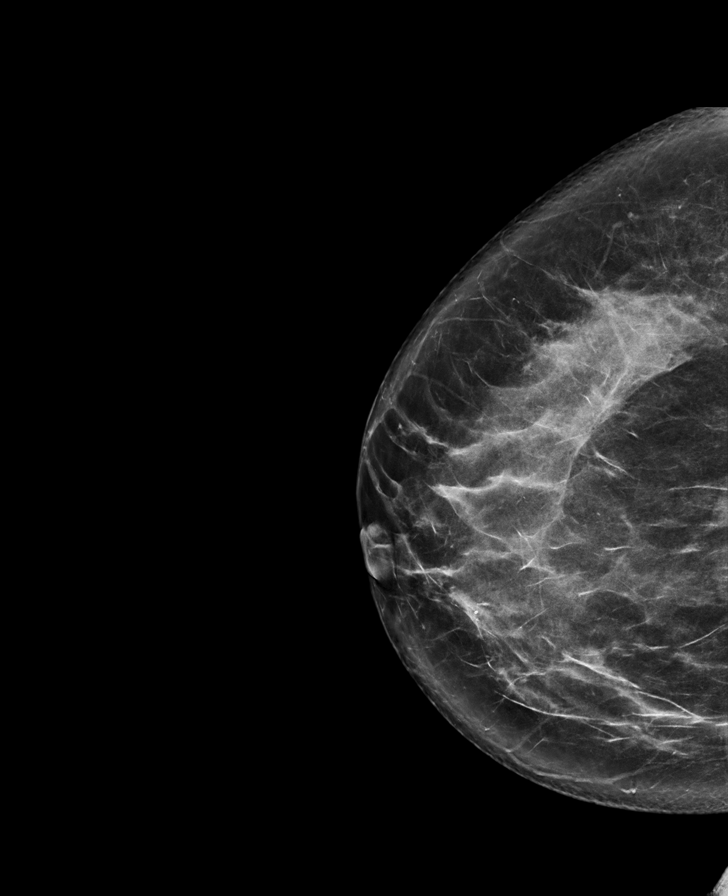

[R MLO synth-2D]
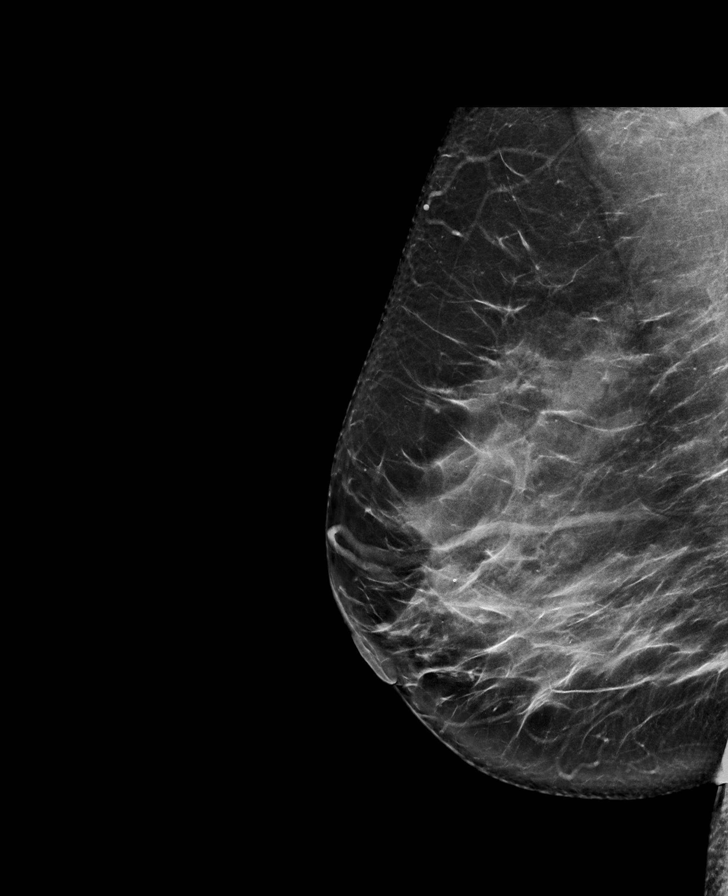

[R MLO tomo · tomo slice 44/87.0]
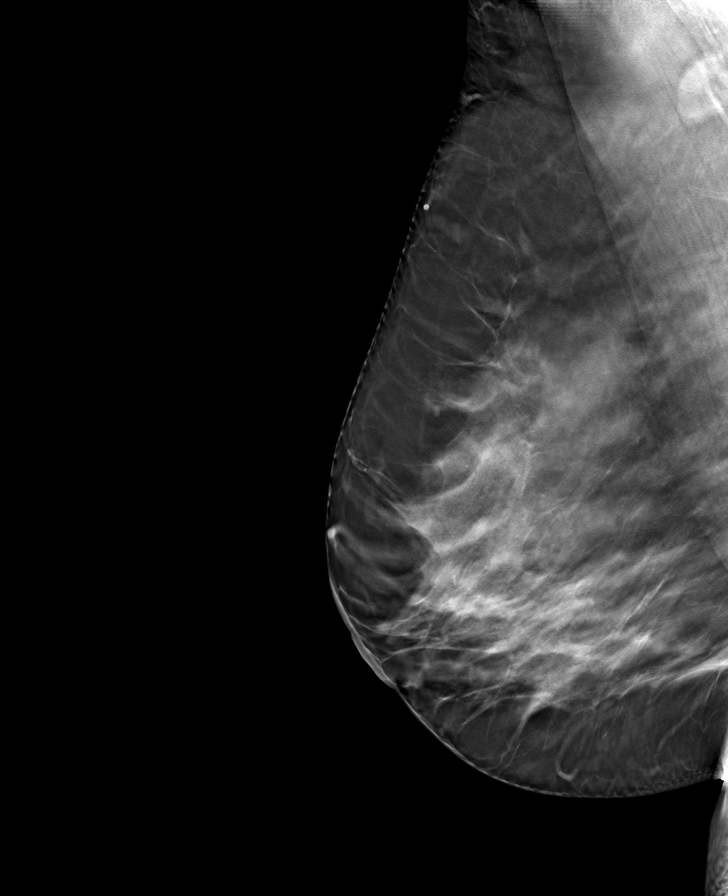

[L MLO tomo · tomo slice 45/90.0]
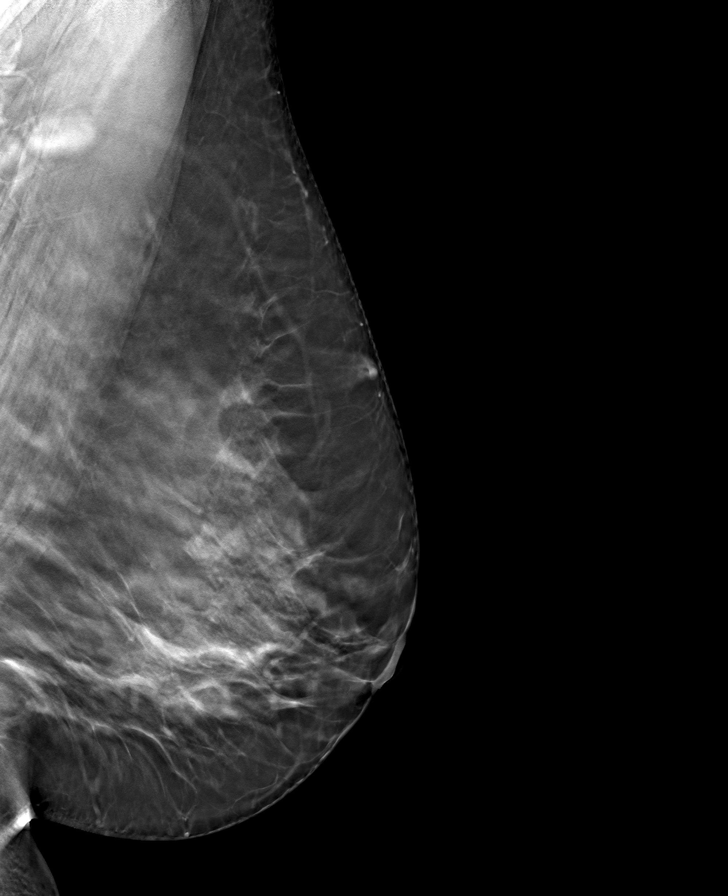

[L CC tomo · tomo slice 42/83.0]
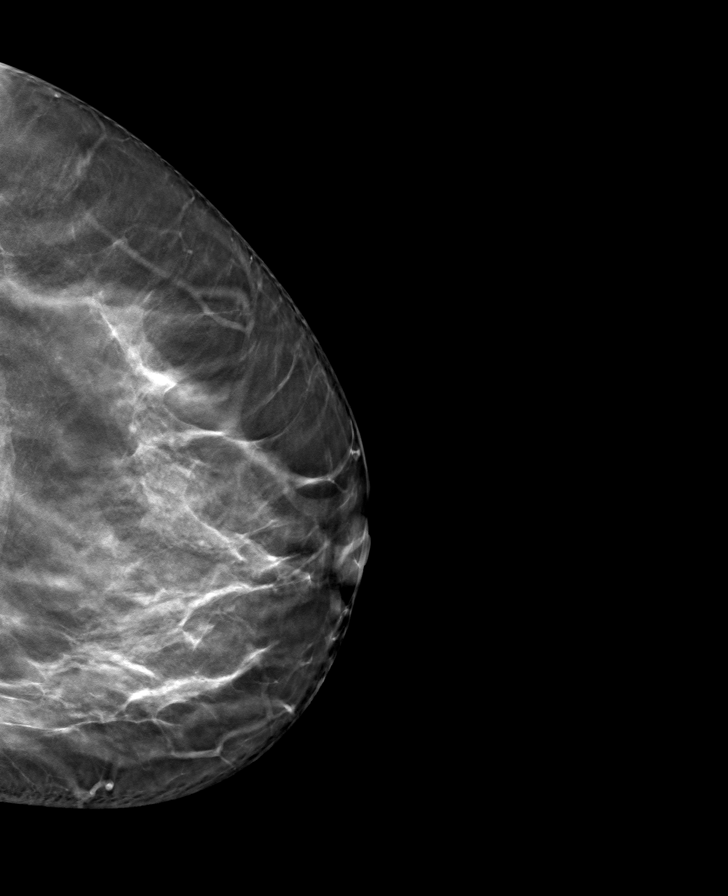

[R CC tomo · tomo slice 43/85.0]
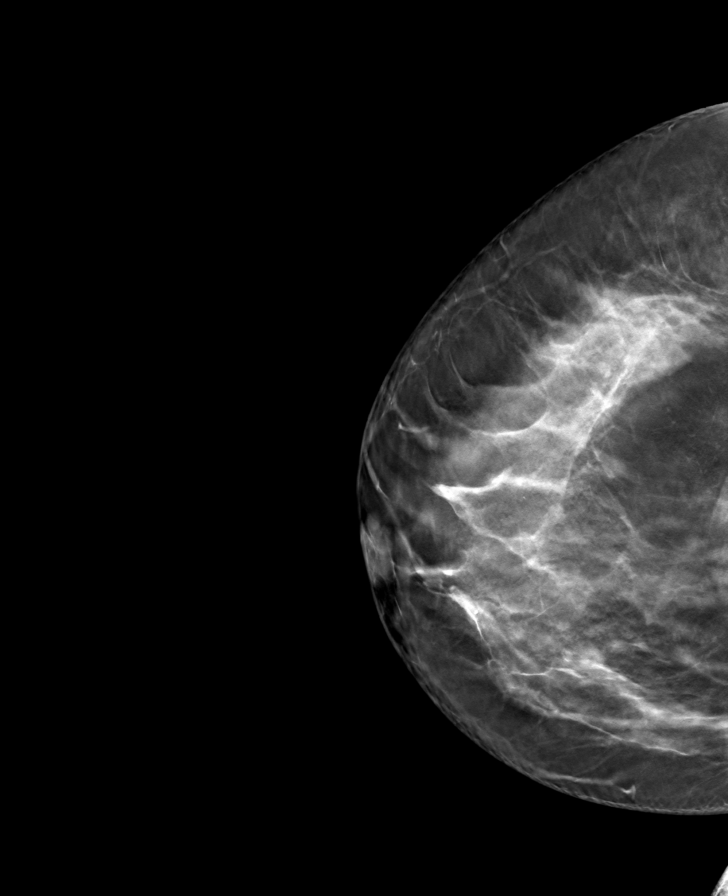

[8 of 24 positions shown; findings below may reference images not displayed]

ACR Breast Density Category c: The breast tissue is heterogeneously
dense, which may obscure small masses.
FINDINGS: There is a partially obscured mass within the upper-outer quadrant
of the RIGHT breast. There are no dominant masses, suspicious
calcifications or secondary signs of malignancy elsewhere within
either breast.

Targeted ultrasound is performed, showing a benign cluster of cysts
in the RIGHT breast at the 10 o'clock axis, 4 cm from the nipple,
measuring 2.7 cm extent, located within a ridge of normal dense
fibroglandular tissue, corresponding to an area of breast pain.

No suspicious solid or cystic mass is seen within the lower RIGHT
breast to upper-outer quadrant of the RIGHT breast, or within the
lower RIGHT axilla, corresponding to the areas of pain.
IMPRESSION: 1. No evidence of malignancy within either breast.
2. Benign cluster of cysts in the RIGHT breast at the 10 o'clock
axis, measuring 2.7 cm extent, corresponding to an area of breast
pain.

RECOMMENDATION:
1.  Screening mammogram in one year.(Code:[id number])
2. Breast pain is a common condition, which will often resolve on
its own without intervention. Benign causes of breast pain, and
possible remedies, were discussed with the patient. Patient was
encouraged to follow-up with referring physician if the pain
persisted or worsened as this might indicate a need to evaluate the
deeper structures of the underlying chest wall. Patient was
instructed to return for additional imaging if a new palpable lump
developed.

I have discussed the findings and recommendations with the patient.
If applicable, a reminder letter will be sent to the patient
regarding the next appointment.

BI-RADS CATEGORY  2: Benign.

## 2024-01-15 ENCOUNTER — Other Ambulatory Visit (HOSPITAL_COMMUNITY): Payer: Self-pay

## 2024-01-15 NOTE — Telephone Encounter (Signed)
 Pharmacy Patient Advocate Encounter   Received notification from Fax that prior authorization for Qulipta  is required/requested.   Insurance verification completed.   The patient is insured through [employer] .   Per test claim: PA required; PA submitted to above mentioned insurance via Fax Key/confirmation #/EOC N/A Status is pending  Received a faxed form from [employer]-faxed completed form along with clinicals to [employer] at [phone]

## 2024-02-12 ENCOUNTER — Ambulatory Visit (INDEPENDENT_AMBULATORY_CARE_PROVIDER_SITE_OTHER): Admitting: Neurology

## 2024-02-12 ENCOUNTER — Encounter: Payer: Self-pay | Admitting: Neurology

## 2024-02-12 VITALS — BP 125/79 | HR 86 | Ht 62.0 in | Wt 194.0 lb

## 2024-02-12 DIAGNOSIS — R400 Somnolence: Secondary | ICD-10-CM | POA: Diagnosis not present

## 2024-02-12 DIAGNOSIS — G43709 Chronic migraine without aura, not intractable, without status migrainosus: Secondary | ICD-10-CM | POA: Diagnosis not present

## 2024-02-12 DIAGNOSIS — R519 Headache, unspecified: Secondary | ICD-10-CM

## 2024-02-12 MED ORDER — QULIPTA 60 MG PO TABS: 60.0000 mg | ORAL_TABLET | Freq: Every day | ORAL | 11 refills | Status: DC

## 2024-02-12 MED ORDER — RIZATRIPTAN BENZOATE 10 MG PO TBDP: 10.0000 mg | ORAL_TABLET | ORAL | 11 refills | Status: AC | PRN

## 2024-02-12 NOTE — Progress Notes (Signed)
 [facility]    Provider:  Dr Ines Requesting Provider: Kip Righter, MD Primary Care Provider:  Kip Righter, MD  CC:  Chronic Migraines 02/12/2024: Here for follow up on migraines. Qulipta  works well but feels tired. Will decrease by 1/2 tab. She is tired throughout the day and wakes a few times.Never had a sleep test. Can wake up with a slight headache. She could nap during the day. Take qulipta  in the morning. She is gaining weight. She has gained 7-8 pounds. Doesn't feel refreshed, don;t feel like she has slept well. Nods off sometimes. Recommended a sleep test if not improved with weight loss. Having 2 migraine days a month and the maxalt  helps. Filled out the copay card for qulipta  with patient. Discussed options such as botox. She had reaction to the injections in the past. Discussed sleep apnea and other sleep-related disorders, she is following up with pcp soon for a workup. No other focal neurologic deficits, associated symptoms, inciting events or modifiable factors.Patient complains of symptoms per HPI as well as the following symptoms: sleepy/fatigue,weight gain . Pertinent negatives and positives per HPI. All others negative    05/01/2023: 2nd month had a big red itchy painful spot, tried different areas for months more, tried for 8 months, towards the 4-5th month migraines started to progress and migraine prevention failed for > 3 months back to baseline Now has 15 migraine days in a month. Has about 2-3 headaches a week, generally last about 24 hours. So almost all month had a headache or migraines. Given the reaction to the injection the reaction worsened over the month and the circle got bigger any place she put it even with pre-treatment, painful to the touch. Options: daily pill 4% of people get some GI discomfort and no injection. Also Vyepti which an infusion every 3 months. Botox for migraines. Leave samples of Qulipta  at the desk. Prescribe it as well. After  this would consider Botox. Have office call for follow up.   Patient complains of symptoms per HPI as well as the following symptoms: none . Pertinent negatives and positives per HPI. All others negative   HPI 09/06/2022:  Cynthia Logan is a 43 y.o. female here as requested by Kip Righter, MD for migraines.   Started to noticed migraines during high school. Associated with menstrual cycles lasting 3-4 days. Occasional have slight headache improved with tylenol  and rest. Now has 15 migraine days in a month. Has about 2-3 headaches a week, generally last about 24 hours. Sometimes wakes up with a headache with daytime fatigue. Does not snore or have apneic episodes at night. Not positional. Describes headache as constant on the left or frontal. Smells like perfumes trigger headaches. During the headache has black spots in vision. No other focal neurologic deficits, associated symptoms, inciting events or modifiable factors.  Sleeping helps with headache but needs to take care of her children.  Working out may help.  Not many triggers she has noticed.   Not hoping to become pregnant. OCP made headaches worse. Birth control: husband has a vasectomy.   From a thorough review of records and patient report, Medications tried that can be used in migraine/headache management greater than 3 months include: Lifestyle modification, headache diaries, better sleep hygiene, exercise, management of migraine triggers, OTC and prescribed analgesics/nsaids such as ibuprofen , excedrin, alleve and others, Topiramate took for > 3 months stopped working  did not improve with increasing dose and headaches were getting worse, had brain frog Nortriptyline  10mg  for about 4 months also stopped working and did not respond to increased doses weight gain, Sumatriptan helped somewhat but made her very sleepy, maalt helps but also makes her sleepy, Labetalol , Ondansetron   Reviewed notes, labs and imaging from outside physicians,  which showed;  CT head 06/03/1999 w[facility]  Review of Systems: Patient complains of symptoms per HPI as well as the following symptoms migraines. Pertinent negatives and positives per HPI. All others negative.   Social History   Socioeconomic History   Marital status: Married    Spouse name: Not on file   Number of children: Not on file   Years of education: Not on file   Highest education level: Not on file  Occupational History   Not on file  Tobacco Use   Smoking status: Never   Smokeless tobacco: Never  Vaping Use   Vaping status: Never Used  Substance and Sexual Activity   Alcohol use: Yes    Comment: social   Drug use: No   Sexual activity: Yes    Birth control/protection: None    Comment: husband had vasectomy  Other Topics Concern   Not on file  Social History Narrative   Caffeine : at least 1 cup/day   Social Drivers of Corporate investment banker Strain: Not on file  Food Insecurity: Not on file  Transportation Needs: Not on file  Physical Activity: Not on file  Stress: Not on file  Social Connections: Not on file  Intimate Partner Violence: Not on file    Family History  Problem Relation Age of Onset   Alcohol abuse Neg Hx    Migraines Neg Hx     Past Medical History:  Diagnosis Date   Medical history non-contributory    Vaginal Pap smear, abnormal     Patient Active Problem List   Diagnosis Date Noted   Daytime somnolence 02/12/2024   Chronic migraine without aura without status migrainosus, not intractable 09/06/2022   Labor and delivery indication for care or intervention 09/04/2019   Pregnancy 09/26/2016    Past Surgical History:  Procedure Laterality Date   COLPOSCOPY     NO PAST SURGERIES      Current Outpatient Medications  Medication Sig Dispense Refill   Atogepant  (QULIPTA ) 60 MG TABS Take 1 tablet (60 mg total) by mouth daily. PLEASE RUN COPAY CARD: BIN [id number] PCN CNRX GRP [id number] ID [id number] EXP 08/14/2024 30 tablet 11    rizatriptan  (MAXALT -MLT) 10 MG disintegrating tablet Take 1 tablet (10 mg total) by mouth as needed for migraine. May repeat in 2 hours if needed 9 tablet 11   No current facility-administered medications for this visit.    Allergies as of 02/12/2024 - Review Complete 02/12/2024  Allergen Reaction Noted   Ajovy  [fremanezumab -vfrm] Rash 02/28/2023    Vitals: BP 125/79 (BP Location: Right Arm, Patient Position: Sitting, Cuff Size: Normal)   Pulse 86   Ht 5' 2 (1.575 m)   Wt 194 lb (88 kg)   Breastfeeding No   BMI 35.48 kg/m  Last Weight:  Wt Readings from Last 1 Encounters:  02/12/24 194 lb (88 kg)   Last Height:   Ht Readings from Last 1 Encounters:  02/12/24 5' 2 (1.575 m)    Physical exam: Exam: Gen: NAD, conversant      CV: No palpitations or chest pain or SOB. VS: Breathing at a normal rate. obese. Not febrile. Eyes: Conjunctivae clear without exudates or hemorrhage  Neuro: Detailed Neurologic Exam  Speech:  Speech is normal; fluent and spontaneous with normal comprehension.  Cognition:    The patient is oriented to person, place, and time;     recent and remote memory intact;     language fluent;     normal attention, concentration, fund of knowledge Cranial Nerves:    The pupils are equal, round, and reactive to light. Visual fields are full Extraocular movements are intact.  The face is symmetric with normal sensation. The palate elevates in the midline. Hearing intact. Voice is normal. Shoulder shrug is normal. The tongue has normal motion without fasciculations.   Coordination: normal  Gait:    No abnormalities noted or reported  Motor Observation:   no involuntary movements noted. Tone:    Appears normal  Posture:    Posture is normal. normal erect    Strength:    Strength is anti-gravity and symmetric in the upper and lower limbs.      Sensation: intact to LT, no reports of numbness or tingling or paresthesias        Assessment/Plan:   Patient with chronic intractable migraines  Ajovy  with progressively worse rash every month, I would say that other cgrp injections are contraindicated at this time. Qulipta  works great >> 70% improvement to 2 migraine days a month and < 6 total headache days a month.  Emergent: Rizatriptan : Please take one tablet at the onset of your headache. If it does not improve the symptoms please take one additional tablet. Do not take more then 2 tablets in 24hrs. Do not take use more then 2 to 3 times in a week.  Discussed Options: Vyepti which an infusion every 3 months. Botox for migraines.  Fatigue: discussed sleep apnea and other sleep-related disorders, she declines speep study prefers to try and lose weight. F/u with pcp. Can try to take qulipta  at bedtime and if that doesn;t work cut pill in half at bedtime.(Per review was reporting sleepiness even before the qulipta , if she wants to switch remind her of this Mechele noted to consider a sleep test last appointments)  From a thorough review of records and patient report, Medications tried that can be used in migraine/headache management greater than 3 months include: Lifestyle modification, headache diaries, better sleep hygiene, exercise, management of migraine triggers, OTC and prescribed analgesics/nsaids such as ibuprofen , excedrin, alleve and others, Topiramate took for > 3 months stopped working  did not improve with increasing dose and headaches were getting worse, had brain frog Nortriptyline 10mg  for about 4 months also stopped working and did not respond to increased doses weight gain, Sumatriptan helped somewhat but made her very sleepy, maalt helps but also makes her sleepy, Labetalol , Ondansetron    Meds ordered this encounter  Medications   Atogepant  (QULIPTA ) 60 MG TABS    Sig: Take 1 tablet (60 mg total) by mouth daily. PLEASE RUN COPAY CARD: BIN [id number] PCN CNRX GRP [id number] ID [id number] EXP 08/14/2024    Dispense:  30 tablet     Refill:  11    PLEASE RUN COPAY CARD: BIN [id number] PCN CNRX GRP [id number] ID [id number] EXP 08/14/2024   rizatriptan  (MAXALT -MLT) 10 MG disintegrating tablet    Sig: Take 1 tablet (10 mg total) by mouth as needed for migraine. May repeat in 2 hours if needed    Dispense:  9 tablet    Refill:  11    Cc: Kip Righter, MD,  Kip Righter, MD  Onetha Epp, MD  [facility] [address] [place], [place] [address]  Phone [phone] Fax [phone]  I spent 31 minutes of face-to-face and non-face-to-face time with patient on the  1. Chronic migraine without aura without status migrainosus, not intractable   2. Morning headache   3. Daytime somnolence    diagnosis.  This included previsit chart review, lab review, study review, order entry, electronic health record documentation, patient education on the different diagnostic and therapeutic options, counseling and coordination of care, risks and benefits of management, compliance, or risk factor reduction

## 2024-02-12 NOTE — Patient Instructions (Addendum)
 Switch to bedtime Cut qulipta  in half as well Consider sleep test   Sleep Apnea  Sleep apnea is a condition that affects your breathing while you are sleeping. Your tongue or soft tissue in your throat may block the flow of air while you sleep. You may have shallow breathing or stop breathing for short periods of time. People with sleep apnea may snore loudly. There are three kinds of sleep apnea: Obstructive sleep apnea. This kind is caused by a blocked or collapsed airway. This is the most common. Central sleep apnea. This kind happens when the part of the brain that controls breathing does not send the correct signals to the muscles that control breathing. Mixed sleep apnea. This is a combination of obstructive and central sleep apnea. What are the causes? The most common cause of sleep apnea is a collapsed or blocked airway. What increases the risk? Being very overweight. Having family members with sleep apnea. Having a tongue or tonsils that are larger than normal. Having a small airway or jaw problems. Being older. What are the signs or symptoms? Loud snoring. Restless sleep. Trouble staying asleep. Being sleepy or tired during the day. Waking up gasping or choking. Having a headache in the morning. Mood swings. Having a hard time remembering things and concentrating. How is this diagnosed? A medical history. A physical exam. A sleep study. This is also called a polysomnography test. This test is done at a sleep lab or in your home while you are sleeping. How is this treated? Treatment may include: Sleeping on your side. Losing weight if you're overweight. Wearing an oral appliance. This is a mouthpiece that moves your lower jaw forward. Using a positive airway pressure (PAP) device to keep your airways open while you sleep, such as: A continuous positive airway pressure (CPAP) device. This device gives forced air through a mask when you breathe out. This keeps your  airways open. A bilevel positive airway pressure (BIPAP) device. This device gives forced air through a mask when you breathe in and when you breathe out to keep your airways open. Having surgery if other treatments do not work. If your sleep apnea is not treated, you may be at risk for: Heart failure. Heart attack. Stroke. Type 2 diabetes or a problem with your blood sugar called insulin resistance. Follow these instructions at home: Medicines Take your medicines only as told by your health care provider. Avoid alcohol, medicines to help you relax, and certain pain medicines. These may make sleep apnea worse. General instructions Do not smoke, vape, or use products with nicotine or tobacco in them. If you need help quitting, talk with your provider. If you were given a PAP device to open your airway while you sleep, use it as told by your provider. If you're having surgery, make sure to tell your provider you have sleep apnea. You may need to bring your PAP device with you. Contact a health care provider if: The PAP device that you were given to use during sleep bothers you or does not seem to be working. You do not feel better or you feel worse. Get help right away if: You have trouble breathing. You have chest pain. You have trouble talking. One side of your body feels weak. A part of your face is hanging down. These symptoms may be an emergency. Call 911 right away. Do not wait to see if the symptoms will go away. Do not drive yourself to the hospital. This information is not  intended to replace advice given to you by your health care provider. Make sure you discuss any questions you have with your health care provider. Document Revised: 05/04/2023 Document Reviewed: 10/06/2022 Elsevier Patient Education  2024 [employer].

## 2024-02-13 ENCOUNTER — Other Ambulatory Visit (HOSPITAL_COMMUNITY): Payer: Self-pay

## 2024-02-13 NOTE — Telephone Encounter (Signed)
 Pharmacy Patient Advocate Encounter   Received notification from Fax that prior authorization for Qulipta  is required/requested.   Insurance verification completed.   The patient is insured through [employer] .   Per test claim: PA required; PA submitted to above mentioned insurance via Fax Key/confirmation #/EOC N/A Status is pending

## 2024-02-15 NOTE — Telephone Encounter (Signed)
   I tried to outreach Cynthia Logan however their call center is not open until 9am. Will outreach again.

## 2024-02-21 ENCOUNTER — Other Ambulatory Visit (HOSPITAL_COMMUNITY): Payer: Self-pay

## 2024-02-21 NOTE — Telephone Encounter (Signed)
 I called [employer] and they stated they can only do PA via Fax-they are faxing form to me, states they will also contact the PT to make her aware we are doing the PA. They do not have a case ID assigned or do they offer reference numbers for calls at [employer]. I will update once I receive PA forms.

## 2024-02-28 ENCOUNTER — Other Ambulatory Visit (HOSPITAL_COMMUNITY): Payer: Self-pay

## 2024-02-28 ENCOUNTER — Other Ambulatory Visit: Payer: Self-pay | Admitting: *Deleted

## 2024-02-28 MED ORDER — QULIPTA 60 MG PO TABS: 60.0000 mg | ORAL_TABLET | Freq: Every day | ORAL | 11 refills | Status: AC

## 2024-02-28 NOTE — Telephone Encounter (Signed)
 Faxed completed PA forms and faxed to [employer] at [phone] along with clinicals-FYI PT is listed as Cynthia Logan under [employer].

## 2024-02-28 NOTE — Telephone Encounter (Signed)
 I called [facility] pharmacy.  They are unable to fill it. Still saying PA needed.  It looks like [facility] is the preferred pharmacy based on the approval notice screenshot below.  I called the patient and LVM (ok per DPR) letting her know I will send to [facility] specialty pharmacy.  She may also want to make sure that her insurance is completely updated at [facility]. Call us  if she cannot get her medication or has any questions. Left office number in message.   Qulipta  sent to [facility].

## 2024-02-28 NOTE — Telephone Encounter (Signed)
 Pharmacy Patient Advocate Encounter  Received notification from [employer] that Prior Authorization for Qulipta  has been APPROVED from 02/28/2024 to 02/27/2025   PA #/Case ID/Reference #: [id number]

## 2025-02-13 ENCOUNTER — Ambulatory Visit: Admitting: Adult Health
